# Patient Record
Sex: Male | Born: 1995
Health system: Southern US, Community
[De-identification: ages and names within clinical notes are randomized; demographics above are authoritative.]

---

## 2019-05-01 ENCOUNTER — Telehealth: Payer: Self-pay | Admitting: Family Medicine

## 2019-05-01 DIAGNOSIS — Z0000 Encounter for general adult medical examination without abnormal findings: Secondary | ICD-10-CM

## 2019-05-01 NOTE — Telephone Encounter (Signed)
Called pt to see if there was any thing in particlar that he wanted ordered since no labs in epic and he not on any meds and nothing on problem list and he just wants what ever screening bw he needs for physical. Not having any problems.

## 2019-05-01 NOTE — Telephone Encounter (Signed)
Pt's dad comes to office and dad asked Dr. Nicki Reaper if he would see his son again and Dr. Nicki Reaper agreed. Pt has a CPE 12/15 and would like to have lab work ordered

## 2019-05-01 NOTE — Telephone Encounter (Signed)
Orders put in and pt notified.  

## 2019-05-01 NOTE — Telephone Encounter (Signed)
CBC, lipid, comprehensive metabolic profile

## 2019-05-14 ENCOUNTER — Encounter: Payer: Self-pay | Admitting: Family Medicine

## 2019-06-14 ENCOUNTER — Encounter: Payer: Self-pay | Admitting: Family Medicine

## 2019-07-25 ENCOUNTER — Encounter: Payer: Self-pay | Admitting: Family Medicine

## 2019-08-16 ENCOUNTER — Telehealth: Payer: Self-pay | Admitting: *Deleted

## 2019-08-16 ENCOUNTER — Other Ambulatory Visit: Payer: Self-pay

## 2019-08-16 ENCOUNTER — Ambulatory Visit
Admission: EM | Admit: 2019-08-16 | Discharge: 2019-08-16 | Disposition: A | Discharge status: Home / Self Care | Payer: 59 | Attending: Emergency Medicine | Admitting: Emergency Medicine

## 2019-08-16 DIAGNOSIS — R3989 Other symptoms and signs involving the genitourinary system: Secondary | ICD-10-CM | POA: Diagnosis present

## 2019-08-16 DIAGNOSIS — R1031 Right lower quadrant pain: Secondary | ICD-10-CM | POA: Diagnosis not present

## 2019-08-16 DIAGNOSIS — N50811 Right testicular pain: Secondary | ICD-10-CM | POA: Diagnosis present

## 2019-08-16 DIAGNOSIS — R300 Dysuria: Secondary | ICD-10-CM | POA: Diagnosis present

## 2019-08-16 LAB — POCT URINALYSIS DIP (MANUAL ENTRY)
Bilirubin, UA: NEGATIVE
Glucose, UA: NEGATIVE mg/dL
Ketones, POC UA: NEGATIVE mg/dL
Leukocytes, UA: NEGATIVE
Nitrite, UA: NEGATIVE
Protein Ur, POC: NEGATIVE mg/dL
Spec Grav, UA: >=1.03 — AB (ref 1.010–1.025)
Urobilinogen, UA: 0.2 E.U./dL
pH, UA: 6 (ref 5.0–8.0)

## 2019-08-16 MED ORDER — IBUPROFEN 800 MG PO TABS: 800.0000 mg | ORAL_TABLET | Freq: Three times a day (TID) | ORAL | 0 refills | Status: DC

## 2019-08-16 NOTE — Discharge Instructions (Signed)
Urine did not show signs of infection, but did show trace blood which could be a sign of a kidney stone Urine culture sent.  We will call you with the results.   Self urethral swab also obtained.  We will follow up with you regarding those results as well.  In the meantime, Push fluids and get plenty of rest.   Ibuprofen 800 mg prescribed.  Take as directed for pain control Anticipate follow up with PCP.  Symptoms may be secondary to kidney stone and/or hernia.  You may need additional imaging to rule these things in or out.   Return here or go to ER if you have any new or worsening symptoms such as fever, nausea, vomiting, worsening groin pain, flank pain, discharge, decrease urine output, etc..Marland Kitchen

## 2019-08-16 NOTE — ED Provider Notes (Signed)
[facility]   CC: RT groin discomfort  SUBJECTIVE:  Darius Ramirez is a 24 y.o. male who complains of bladder pressure, RT groin/ testicle discomfort, and dysuria x 1 week.  Patient denies a precipitating event, recent sexual encounter, excessive caffeine intake, straining, working-out or heavy lifting prior to symptoms.  Last sex 2 week ago.  Is married in a monogamous relationship.  1 partner over the past 6 months.  Partner without symptoms.  Localizes the discomfort to RT groin/ testicle, describes as intermittent and dull.  Has tried OTC AZO with temporary relief.  Symptoms are made worse with activity.  Denies similars symptoms.  Denies fever, chills, nausea, vomiting, CP, SOB, urethral discharge, testicular swelling, testicular discoloration, genital rashes or lesions, decreased urine output, changes in bowel habits.    LMP: No LMP for male patient.  ROS: As in HPI.  All other pertinent ROS negative.     History reviewed. No pertinent past medical history. History reviewed. No pertinent surgical history. No Known Allergies No current facility-administered medications on file prior to encounter.   No current outpatient medications on file prior to encounter.   Social History   Socioeconomic History  . Marital status: Married    Spouse name: Not on file  . Number of children: Not on file  . Years of education: Not on file  . Highest education level: Not on file  Occupational History  . Not on file  Tobacco Use  . Smoking status: Never Smoker  . Smokeless tobacco: Never Used  Substance and Sexual Activity  . Alcohol use: Never  . Drug use: Never  . Sexual activity: Yes    Birth control/protection: None    Comment: states he is married, monogamous   Other Topics Concern  . Not on file  Social History Narrative  . Not on file   Social Determinants of Health   Financial Resource Strain:   . Difficulty of Paying Living Expenses:   Food Insecurity:   .  Worried About Charity fundraiser in the Last Year:   . Arboriculturist in the Last Year:   Transportation Needs:   . Film/video editor (Medical):   Marland Kitchen Lack of Transportation (Non-Medical):   Physical Activity:   . Days of Exercise per Week:   . Minutes of Exercise per Session:   Stress:   . Feeling of Stress :   Social Connections:   . Frequency of Communication with Friends and Family:   . Frequency of Social Gatherings with Friends and Family:   . Attends Religious Services:   . Active Member of Clubs or Organizations:   . Attends Archivist Meetings:   Marland Kitchen Marital Status:   Intimate Partner Violence:   . Fear of Current or Ex-Partner:   . Emotionally Abused:   Marland Kitchen Physically Abused:   . Sexually Abused:    Family History  Problem Relation Age of Onset  . Hyperlipidemia Mother   . Diabetes Father   . Hypertension Father   . Hyperlipidemia Father     OBJECTIVE:  Vitals:   08/16/19 1419  BP: (!) 163/103  Pulse: (!) 108  Resp: 20  Temp: 98.4 F (36.9 C)  TempSrc: Oral  SpO2: 97%   General appearance: Alert in no acute distress HEENT: NCAT.  PERRL, EOMI grossly; Oropharynx clear.  Lungs: clear to auscultation bilaterally without adventitious breath sounds Heart: regular rate and rhythm.   Abdomen: soft; non-distended; no tenderness; bowel sounds present;  no guarding Back: no CVA tenderness GU: Declines chaperone.  Circumcised male; no obvious rashes or lesions; no penile discharge; no testicular masses or nodules appreciated, mild tenderness over RT epididymis; negative phren's sign; no obvious swelling; testicles appear symmetrical in size; no discoloration; possible subtle RT inguinal hernia Extremities: no edema; symmetrical with no gross deformities Skin: warm and dry Neurologic: Ambulates from chair to exam table without difficulty Psychological: alert and cooperative; normal mood and affect  Labs Reviewed  POCT URINALYSIS DIP (MANUAL ENTRY) -  Abnormal; Notable for the following components:      Result Value   Spec Grav, UA >=1.030 (*)    Blood, UA trace-intact (*)    All other components within normal limits  URINE CULTURE  CYTOLOGY, (ORAL, ANAL, URETHRAL) ANCILLARY ONLY    ASSESSMENT & PLAN:  1. Right groin pain   2. Right testicular pain   3. Dysuria   4. Sensation of pressure in bladder area     Meds ordered this encounter  Medications  . ibuprofen (ADVIL) 800 MG tablet    Sig: Take 1 tablet (800 mg total) by mouth 3 (three) times daily.    Dispense:  30 tablet    Refill:  0    Order Specific Question:   Supervising Provider    Answer:   Raylene Everts [id number]   Urine did not show signs of infection, but did show trace blood which could be a sign of a kidney stone Urine culture sent.  We will call you with the results.   Self urethral swab also obtained.  We will follow up with you regarding those results as well.  In the meantime, Push fluids and get plenty of rest.   Ibuprofen 800 mg prescribed.  Take as directed for pain control Anticipate follow up with PCP.  Symptoms may be secondary to kidney stone and/or hernia.  You may need additional imaging to rule these things in or out.   Return here or go to ER if you have any new or worsening symptoms such as fever, nausea, vomiting, worsening groin pain, flank pain, discharge, decrease urine output, etc...  Outlined signs and symptoms indicating need for more acute intervention. Patient verbalized understanding. After Visit Summary given.     Lestine Box, PA-C 08/16/19 1513

## 2019-08-16 NOTE — Telephone Encounter (Signed)
Pt called and states he is having pain in lower abdomen that started 5 days ago. Burning and sore testicle. No available appts today. Advised to go to urgent care. Pt states he will.

## 2019-08-16 NOTE — ED Triage Notes (Signed)
Pt reports lower abdominal pressure in lower abdomen.  Will periodically have pain in R testicle which travels up R groin.  Denies swelling or color changes.  Reports burning, frequency with urination.  States no exposure to STDs.   States wife just had baby and was told she had strep B.

## 2019-08-18 LAB — URINE CULTURE
Culture: NO GROWTH
Special Requests: NORMAL

## 2019-08-20 LAB — LIPID PANEL
Chol/HDL Ratio: 7.8 ratio — ABNORMAL HIGH (ref 0.0–5.0)
Cholesterol, Total: 226 mg/dL — ABNORMAL HIGH (ref 100–199)
HDL: 29 mg/dL — ABNORMAL LOW (ref >39)
LDL Chol Calc (NIH): 145 mg/dL — ABNORMAL HIGH (ref 0–99)
Triglycerides: 281 mg/dL — ABNORMAL HIGH (ref 0–149)
VLDL Cholesterol Cal: 52 mg/dL — ABNORMAL HIGH (ref 5–40)

## 2019-08-20 LAB — CBC WITH DIFFERENTIAL/PLATELET
Basophils Absolute: 0.1 10*3/uL (ref 0.0–0.2)
Basos: 1 %
EOS (ABSOLUTE): 0.4 10*3/uL (ref 0.0–0.4)
Eos: 5 %
Hematocrit: 48.4 % (ref 37.5–51.0)
Hemoglobin: 16.5 g/dL (ref 13.0–17.7)
Immature Grans (Abs): 0 10*3/uL (ref 0.0–0.1)
Immature Granulocytes: 0 %
Lymphocytes Absolute: 2.6 10*3/uL (ref 0.7–3.1)
Lymphs: 31 %
MCH: 29.8 pg (ref 26.6–33.0)
MCHC: 34.1 g/dL (ref 31.5–35.7)
MCV: 88 fL (ref 79–97)
Monocytes Absolute: 0.7 10*3/uL (ref 0.1–0.9)
Monocytes: 9 %
Neutrophils Absolute: 4.5 10*3/uL (ref 1.4–7.0)
Neutrophils: 54 %
Platelets: 261 10*3/uL (ref 150–450)
RBC: 5.53 x10E6/uL (ref 4.14–5.80)
RDW: 12.7 % (ref 11.6–15.4)
WBC: 8.2 10*3/uL (ref 3.4–10.8)

## 2019-08-20 LAB — CYTOLOGY, (ORAL, ANAL, URETHRAL) ANCILLARY ONLY
Chlamydia: NEGATIVE
Neisseria Gonorrhea: NEGATIVE
Trichomonas: NEGATIVE

## 2019-08-20 LAB — COMPREHENSIVE METABOLIC PANEL
ALT: 147 IU/L — ABNORMAL HIGH (ref 0–44)
AST: 55 IU/L — ABNORMAL HIGH (ref 0–40)
Albumin/Globulin Ratio: 1.5 (ref 1.2–2.2)
Albumin: 4.9 g/dL (ref 4.1–5.2)
Alkaline Phosphatase: 61 IU/L (ref 39–117)
BUN/Creatinine Ratio: 10 (ref 9–20)
BUN: 11 mg/dL (ref 6–20)
Bilirubin Total: 0.5 mg/dL (ref 0.0–1.2)
CO2: 21 mmol/L (ref 20–29)
Calcium: 9.8 mg/dL (ref 8.7–10.2)
Chloride: 100 mmol/L (ref 96–106)
Creatinine, Ser: 1.05 mg/dL (ref 0.76–1.27)
GFR calc Af Amer: 113 mL/min/{1.73_m2} (ref >59)
GFR calc non Af Amer: 98 mL/min/{1.73_m2} (ref >59)
Globulin, Total: 3.2 g/dL (ref 1.5–4.5)
Glucose: 96 mg/dL (ref 65–99)
Potassium: 4.3 mmol/L (ref 3.5–5.2)
Sodium: 140 mmol/L (ref 134–144)
Total Protein: 8.1 g/dL (ref 6.0–8.5)

## 2019-08-23 ENCOUNTER — Ambulatory Visit (INDEPENDENT_AMBULATORY_CARE_PROVIDER_SITE_OTHER): Payer: 59 | Admitting: Family Medicine

## 2019-08-23 ENCOUNTER — Encounter: Payer: Self-pay | Admitting: Family Medicine

## 2019-08-23 ENCOUNTER — Other Ambulatory Visit: Payer: Self-pay

## 2019-08-23 VITALS — BP 122/76 | Temp 97.3°F | Ht 69.0 in | Wt 224.0 lb

## 2019-08-23 DIAGNOSIS — R300 Dysuria: Secondary | ICD-10-CM | POA: Diagnosis not present

## 2019-08-23 DIAGNOSIS — N50819 Testicular pain, unspecified: Secondary | ICD-10-CM

## 2019-08-23 DIAGNOSIS — R7401 Elevation of levels of liver transaminase levels: Secondary | ICD-10-CM

## 2019-08-23 DIAGNOSIS — R197 Diarrhea, unspecified: Secondary | ICD-10-CM | POA: Diagnosis not present

## 2019-08-23 DIAGNOSIS — Z0000 Encounter for general adult medical examination without abnormal findings: Secondary | ICD-10-CM

## 2019-08-23 LAB — POCT URINALYSIS DIPSTICK
Spec Grav, UA: 1.02 (ref 1.010–1.025)
pH, UA: 5 (ref 5.0–8.0)

## 2019-08-23 NOTE — Progress Notes (Signed)
Subjective:    Patient ID: Darius Ramirez, male    DOB: Jun 09, 1995, 24 y.o.   MRN: [id number]  HPI The patient comes in today for a wellness visit.    A review of their health history was completed.  A review of medications was also completed.  Any needed refills; not on any meds  Eating habits: health conscious  Falls/  MVA accidents in past few months: none  Regular exercise: none  Specialist pt sees on regular basis: none  Preventative health issues were discussed.   Additional concerns: pelvic and testicle pain  Results for orders placed or performed in visit on 08/23/19  POCT urinalysis dipstick  Result Value Ref Range   Color, UA     Clarity, UA     Glucose, UA     Bilirubin, UA     Ketones, UA     Spec Grav, UA 1.020 1.010 - 1.025   Blood, UA     pH, UA 5.0 5.0 - 8.0   Protein, UA     Urobilinogen, UA     Nitrite, UA     Leukocytes, UA     Appearance     Odor     Patient denies any type of chest tightness pressure pain shortness of breath Does have some intermittent diarrhea issues not mucousy not bloody but this Off and on the past couple weeks he does state he took some antibiotics for dysuria that may physician friend wrote for him He has had some intermittent right lower quadrant discomfort that radiates into the testicle he is concerned about the possibility of kidney stone or other problems Patient did have lab work done which showed some significant issues we reviewed all that in detail today which went beyond standard wellness Results for orders placed or performed in visit on 08/23/19  POCT urinalysis dipstick  Result Value Ref Range   Color, UA     Clarity, UA     Glucose, UA     Bilirubin, UA     Ketones, UA     Spec Grav, UA 1.020 1.010 - 1.025   Blood, UA     pH, UA 5.0 5.0 - 8.0   Protein, UA     Urobilinogen, UA     Nitrite, UA     Leukocytes, UA     Appearance     Odor      Review of Systems  Constitutional: Negative for  activity change, appetite change and fever.  HENT: Negative for congestion and rhinorrhea.   Eyes: Negative for discharge.  Respiratory: Negative for cough and wheezing.   Cardiovascular: Negative for chest pain.  Gastrointestinal: Negative for abdominal pain, blood in stool and vomiting.  Genitourinary: Negative for difficulty urinating and frequency.  Musculoskeletal: Negative for neck pain.  Skin: Negative for rash.  Allergic/Immunologic: Negative for environmental allergies and food allergies.  Neurological: Negative for weakness and headaches.  Psychiatric/Behavioral: Negative for agitation.       Objective:   Physical Exam Constitutional:      Appearance: He is well-developed.  HENT:     Head: Normocephalic and atraumatic.     Right Ear: External ear normal.     Left Ear: External ear normal.     Nose: Nose normal.  Eyes:     Pupils: Pupils are equal, round, and reactive to light.  Neck:     Thyroid: No thyromegaly.  Cardiovascular:     Rate and Rhythm: Normal rate and regular rhythm.  Heart sounds: Normal heart sounds. No murmur.  Pulmonary:     Effort: Pulmonary effort is normal. No respiratory distress.     Breath sounds: Normal breath sounds. No wheezing.  Abdominal:     General: Bowel sounds are normal. There is no distension.     Palpations: Abdomen is soft. There is no mass.     Tenderness: There is no abdominal tenderness.  Genitourinary:    Penis: Normal.   Musculoskeletal:        General: Normal range of motion.     Cervical back: Normal range of motion and neck supple.  Lymphadenopathy:     Cervical: No cervical adenopathy.  Skin:    General: Skin is warm and dry.     Findings: No erythema.  Neurological:     Mental Status: He is alert.     Motor: No abnormal muscle tone.  Psychiatric:        Behavior: Behavior normal.        Judgment: Judgment normal.     Testicular exam normal prostate exam normal no hernia      Assessment & Plan:    1. Routine general medical examination at a health care facility Adult wellness-complete.wellness physical was conducted today. Importance of diet and exercise were discussed in detail.  In addition to this a discussion regarding safety was also covered. We also reviewed over immunizations and gave recommendations regarding current immunization needed for age.  In addition to this additional areas were also touched on including: Preventative health exams needed:  Colonoscopy not indicated  Patient was advised yearly wellness exam  - POCT urinalysis dipstick  2. Diarrhea, unspecified type Having some diarrhea but was on antibiotics for several days at a physician friend gave him could be related to C. difficile do stool culture and C. difficile - Stool Culture - Clostridium Difficile by PCR  3. Elevated transaminase level Patient has significant elevation of liver enzymes does not drink denies any drug use we will need to do lab work and ultrasound depending on this could be fatty liver could be other underlying cause may need gastroenterology consultation stay away from all alcohol minimal Tylenol use recommended healthy diet regular activity and losing weight recommended - US Abdomen Limited RUQ - Hepatitis B Surface AntiGEN - Hepatitis C antibody - Anti-smooth muscle antibody, IgG - Ceruloplasmin - Iron Binding Cap (TIBC)([facility]) - Ferritin  4. Dysuria Patient with right testicular pain lower pelvic pain urinalysis negative for infection had negative GC chlamydia negative culture and under the microscope no red blood cells referral to urology for further evaluation may need ultrasound - Ambulatory referral to Urology  5. Pain in testicle, unspecified laterality Complains of pain in the testicle region but no masses are felt referral to urology - Ambulatory referral to Urology

## 2019-08-23 NOTE — Telephone Encounter (Signed)
error 

## 2019-08-24 LAB — CLOSTRIDIUM DIFFICILE BY PCR: Toxigenic C. Difficile by PCR: NEGATIVE

## 2019-08-27 LAB — IRON AND TIBC
Iron Saturation: 19 % (ref 15–55)
Iron: 64 ug/dL (ref 38–169)
Total Iron Binding Capacity: 329 ug/dL (ref 250–450)
UIBC: 265 ug/dL (ref 111–343)

## 2019-08-27 LAB — FERRITIN: Ferritin: 298 ng/mL (ref 30–400)

## 2019-08-27 LAB — HEPATITIS C ANTIBODY: Hep C Virus Ab: 0.1 s/co ratio (ref 0.0–0.9)

## 2019-08-27 LAB — ANTI-SMOOTH MUSCLE ANTIBODY, IGG: Smooth Muscle Ab: 4 Units (ref 0–19)

## 2019-08-27 LAB — CERULOPLASMIN: Ceruloplasmin: 24.1 mg/dL (ref 16.0–31.0)

## 2019-08-27 LAB — HEPATITIS B SURFACE ANTIGEN: Hepatitis B Surface Ag: NEGATIVE

## 2019-08-28 ENCOUNTER — Encounter: Payer: Self-pay | Admitting: Family Medicine

## 2019-08-28 LAB — STOOL CULTURE: E coli, Shiga toxin Assay: NEGATIVE

## 2019-08-29 ENCOUNTER — Encounter: Payer: Self-pay | Admitting: Family Medicine

## 2019-09-03 ENCOUNTER — Ambulatory Visit (HOSPITAL_COMMUNITY)
Admission: RE | Admit: 2019-09-03 | Discharge: 2019-09-03 | Disposition: A | Discharge status: Home / Self Care | Payer: 59 | Source: Ambulatory Visit | Attending: Family Medicine | Admitting: Family Medicine

## 2019-09-03 ENCOUNTER — Other Ambulatory Visit: Payer: Self-pay | Admitting: *Deleted

## 2019-09-03 ENCOUNTER — Other Ambulatory Visit: Payer: Self-pay

## 2019-09-03 DIAGNOSIS — R7401 Elevation of levels of liver transaminase levels: Secondary | ICD-10-CM | POA: Insufficient documentation

## 2019-09-03 DIAGNOSIS — R748 Abnormal levels of other serum enzymes: Secondary | ICD-10-CM

## 2019-09-24 ENCOUNTER — Telehealth: Payer: Self-pay | Admitting: Family Medicine

## 2019-09-24 DIAGNOSIS — R109 Unspecified abdominal pain: Secondary | ICD-10-CM

## 2019-09-24 DIAGNOSIS — R748 Abnormal levels of other serum enzymes: Secondary | ICD-10-CM

## 2019-09-24 DIAGNOSIS — R198 Other specified symptoms and signs involving the digestive system and abdomen: Secondary | ICD-10-CM

## 2019-09-24 NOTE — Telephone Encounter (Signed)
Please let the patient know that I am perfectly on board with that Go ahead with referral [facility] elevated liver enzymes and abdominal symptoms Please explain referral process to the patient thank you

## 2019-09-24 NOTE — Telephone Encounter (Signed)
Left message for pt to return call. Referral in.

## 2019-09-24 NOTE — Telephone Encounter (Signed)
Pt called requesting referral to GI, states Dr. Nicki Reaper wanted to wait for repeat labs but pt states symptoms continue, are no worse but no better  Please advise & if "ok" to refer - please initiate referral so it may be processed

## 2019-10-07 ENCOUNTER — Encounter: Payer: Self-pay | Admitting: Family Medicine

## 2019-10-08 ENCOUNTER — Encounter: Payer: Self-pay | Admitting: Internal Medicine

## 2019-10-18 ENCOUNTER — Ambulatory Visit: Payer: 59 | Admitting: Family Medicine

## 2019-10-18 ENCOUNTER — Other Ambulatory Visit: Payer: Self-pay

## 2019-10-18 ENCOUNTER — Encounter: Payer: Self-pay | Admitting: Family Medicine

## 2019-10-18 VITALS — BP 128/78 | HR 104 | Temp 97.3°F | Wt 220.6 lb

## 2019-10-18 DIAGNOSIS — R590 Localized enlarged lymph nodes: Secondary | ICD-10-CM

## 2019-10-18 DIAGNOSIS — K0510 Chronic gingivitis, plaque induced: Secondary | ICD-10-CM

## 2019-10-18 MED ORDER — AMOXICILLIN 500 MG PO CAPS: 500.0000 mg | ORAL_CAPSULE | Freq: Two times a day (BID) | ORAL | 0 refills | Status: DC

## 2019-10-18 NOTE — Progress Notes (Signed)
Patient ID: Darius Ramirez, male    DOB: 04/28/1996, 24 y.o.   MRN: [id number]   Chief Complaint  Patient presents with  . lymph node swelling   Subjective:    HPI  Patient comes in today after a sore throat 1 month ago that left swollen glands on right side of neck.  The lymph node is mobile and non-tender. No fever.  Patient not having any pain or discomfort. Has had some rt lower tooth discomfort and is seeing dentist next week.  No abscess in mouth or tooth pain.  Gums are red on right lower molars.  Denies cavity or broken tooth. Has appt with dentist in 4 days to see his irritated gums.  No n/v/d, coughing, runny nose, ear pain, or rash on head/neck.   Medical History Darius Ramirez has no past medical history on file.   Outpatient Encounter Medications as of 10/18/2019  Medication Sig  . amoxicillin (AMOXIL) 500 MG capsule Take 1 capsule (500 mg total) by mouth 2 (two) times daily.  Marland Kitchen ibuprofen (ADVIL) 800 MG tablet Take 1 tablet (800 mg total) by mouth 3 (three) times daily.   No facility-administered encounter medications on file as of 10/18/2019.     Review of Systems  Constitutional: Negative for chills, fatigue and fever.  HENT: Positive for dental problem (redness to gums). Negative for ear pain, rhinorrhea, sinus pressure, sinus pain and sore throat.        +enlarged lump on rt neck  Eyes: Negative for pain, discharge and itching.  Respiratory: Negative for cough.   Skin: Negative for rash and wound.     Vitals BP 128/78   Pulse (!) 104   Temp (!) 97.3 F (36.3 C)   Wt 220 lb 9.6 oz (100.1 kg)   SpO2 98%   BMI 32.58 kg/m   Objective:   Physical Exam Vitals and nursing note reviewed.  Constitutional:      General: He is not in acute distress.    Appearance: Normal appearance. He is not ill-appearing.  HENT:     Head: Normocephalic and atraumatic.     Right Ear: Ear canal and external ear normal. There is no impacted cerumen.     Left Ear: Ear  canal and external ear normal. There is no impacted cerumen.     Nose: Nose normal. No congestion or rhinorrhea.     Mouth/Throat:     Mouth: Mucous membranes are moist.     Pharynx: No oropharyngeal exudate or posterior oropharyngeal erythema.     Comments: +erythema and swelling to gingiva on rt lower molars. No abscess or broken teeth or dental caries. Eyes:     Extraocular Movements: Extraocular movements intact.     Conjunctiva/sclera: Conjunctivae normal.     Pupils: Pupils are equal, round, and reactive to light.  Cardiovascular:     Pulses: Normal pulses.  Pulmonary:     Effort: Pulmonary effort is normal. No respiratory distress.  Musculoskeletal:     Cervical back: Normal range of motion.  Lymphadenopathy:     Cervical: Cervical adenopathy (small mobile LN on rt anterior cervical area) present.  Skin:    General: Skin is warm and dry.     Findings: No erythema or rash.  Neurological:     Mental Status: He is alert.      Assessment and Plan   1. Gingivitis - amoxicillin (AMOXIL) 500 MG capsule; Take 1 capsule (500 mg total) by mouth 2 (two) times daily.  Dispense: 14 capsule; Refill: 0  2. Cervical lymphadenopathy - amoxicillin (AMOXIL) 500 MG capsule; Take 1 capsule (500 mg total) by mouth 2 (two) times daily.  Dispense: 14 capsule; Refill: 0    Gave amoxicillin and f/u with dentist next week as scheduled.  Cont with mouth washes and flossing.  Brushing teeth 2x per day. Likely cervical lymphadenopathy related to gingival infection. If worsening the rto.  Pt in agreement.  F/u prn.

## 2019-10-18 NOTE — Patient Instructions (Signed)
Gingivitis Gingivitis is redness, soreness, and swelling (inflammation) of the gums. This condition is usually mild and clears up with treatment and proper cleaning of the teeth. What are the causes? This condition is caused by germs (plaque) that build up on the teeth and gums. This happens because of poor care and cleaning of the mouth and teeth (oral hygiene). What increases the risk? The following factors may make you more likely to get this condition:  Not properly cleaning and taking care of your mouth and teeth.  Eating a diet that does not provide proper nutrition.  Taking certain medicines.  Being pregnant.  Going through puberty or menopause.  Using tobacco products.  Having certain medical conditions, such as: ? Diabetes. ? Some infections. ? Dry mouth.  Wearing dental devices that do not fit well. What are the signs or symptoms?   Gums that bleed easily. This may happen during flossing or brushing.  Swollen gums.  Gums that are bright red or purple.  Receding gums. This means that the gums are wearing away from the teeth so that more of the tooth is exposed.  Bad breath. How is this treated? This condition may be treated by:  Having your teeth and gums cleaned at the dentist's office.  Taking good care of your mouth and teeth at home. This includes careful brushing and regular flossing.  Using a mouthwash. In some cases, a mouthwash may be prescribed or suggested. Very bad cases of this condition may be treated with antibiotic medicine or surgery. Follow these instructions at home:      Follow instructions from your dentist about how to clean your teeth. Make sure you: ? Brush your teeth two times a day with a soft-bristled toothbrush. ? Floss at least one time each day. Do this before you brush your teeth. ? Use a mouthwash as told by your dentist.  Eat a well-balanced diet. Between meals, limit your intake of foods and drinks that have sugar in  them.  See a dentist on a regular basis for cleaning and checkups.  Do not use any products that contain nicotine or tobacco. These products include cigarettes, e-cigarettes, and chewing tobacco. If you need help quitting, ask your doctor.  If you were prescribed an antibiotic medicine, take it as told by your dentist. Do not stop using it even if you start to feel better.  Keep all follow-up visits as told by your dentist. This is important. Contact a doctor if:  You have a fever.  You have a lot of bleeding from your gums.  You have pain in your gums or teeth.  You have trouble chewing.  You have any loose or infected teeth.  You have swollen glands in your face or neck. Summary  Gingivitis is redness, soreness, and swelling (inflammation) of the gums.  This condition often clears up with treatment and proper cleaning of the teeth.  Brush your teeth two times a day. Floss at least one time each day.  Keep all follow-up visits as told by your dentist. This information is not intended to replace advice given to you by your health care provider. Make sure you discuss any questions you have with your health care provider. Document Revised: 12/20/2017 Document Reviewed: 12/20/2017 Elsevier Patient Education  [employer].

## 2019-11-12 ENCOUNTER — Ambulatory Visit: Payer: 59 | Admitting: Internal Medicine

## 2020-01-15 ENCOUNTER — Other Ambulatory Visit: Payer: Self-pay

## 2020-01-15 ENCOUNTER — Encounter: Payer: Self-pay | Admitting: Family Medicine

## 2020-01-15 ENCOUNTER — Ambulatory Visit: Payer: 59 | Admitting: Family Medicine

## 2020-01-15 VITALS — BP 130/82 | Temp 97.3°F | Wt 216.4 lb

## 2020-01-15 DIAGNOSIS — L0291 Cutaneous abscess, unspecified: Secondary | ICD-10-CM

## 2020-01-15 MED ORDER — DOXYCYCLINE HYCLATE 100 MG PO TABS: 100.0000 mg | ORAL_TABLET | Freq: Two times a day (BID) | ORAL | 0 refills | Status: DC

## 2020-01-15 NOTE — Progress Notes (Signed)
   Subjective:    Patient ID: Darius Ramirez, male    DOB: 21-Feb-1996, 24 y.o.   MRN: [id number]  HPI Pt here for sore on inner part of left leg near groin area. Swollen, red, painful and hot to touch. Pt has popped area twice and did get blood and pus out. Pt states that it has been there about 3 days. He states this area became sore than it drained then it became sore again then drained again now is doing a little bit better denies fever chills Review of Systems See above    Objective:   Physical Exam  On examination small abscess left inguinal area no lymph node firm area with some fluctuance  With patient's permission the area was numbed with 1% lidocaine.  Betadine applied 11.  Blade for incision Minimal discharge Bandage place     Assessment & Plan:  Small abscess Minimal drainage with incision Antibiotics warm compresses recommended Warning signs discussed Follow-up if ongoing troubles

## 2020-01-19 LAB — WOUND CULTURE: Organism ID, Bacteria: NONE SEEN

## 2020-01-19 LAB — SPECIMEN STATUS REPORT

## 2020-04-10 IMAGING — US US ABDOMEN LIMITED
1 series · 14 of 25 positions shown · non-contrast
Comparison: No prior.

CLINICAL DATA: Elevated LFTs.

EXAM:
ULTRASOUND ABDOMEN LIMITED RIGHT UPPER QUADRANT

[Series 1: us abdomen limited · 0.24mm/px · 14 of 61 slices shown]
[im 1/61]
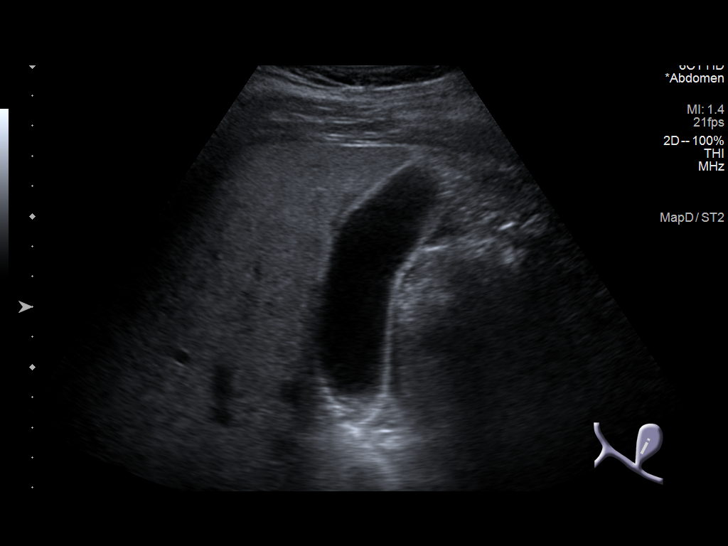
[im 6/61]
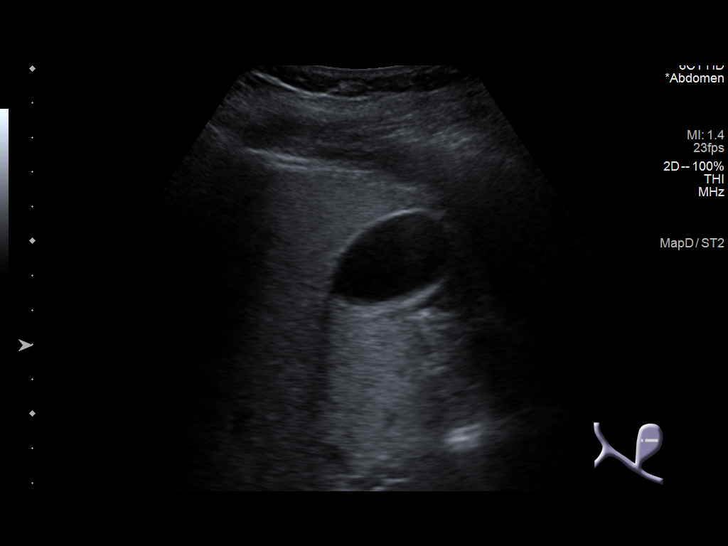
[im 11/61]
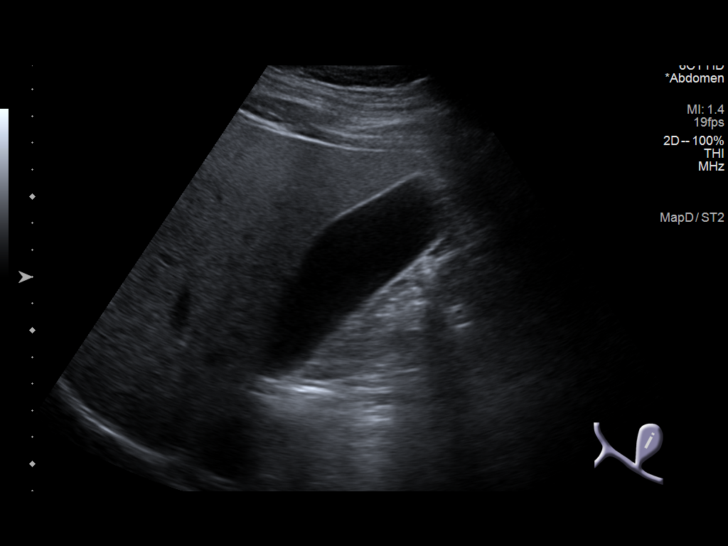
[im 16/61]
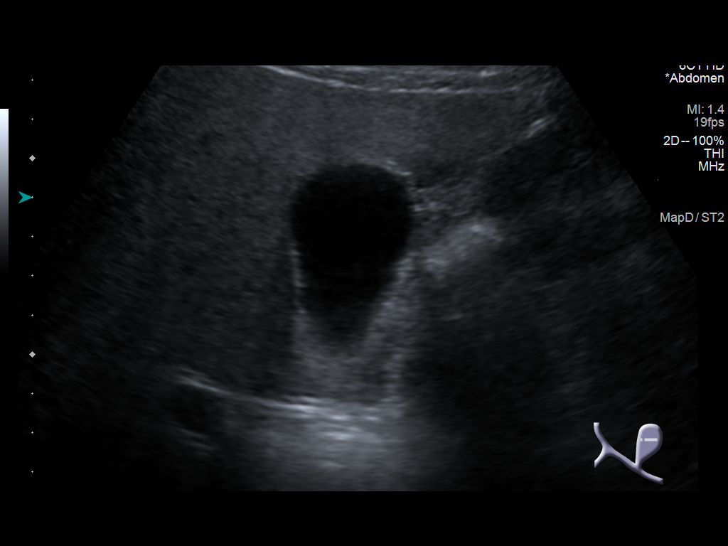
[im 21/61]
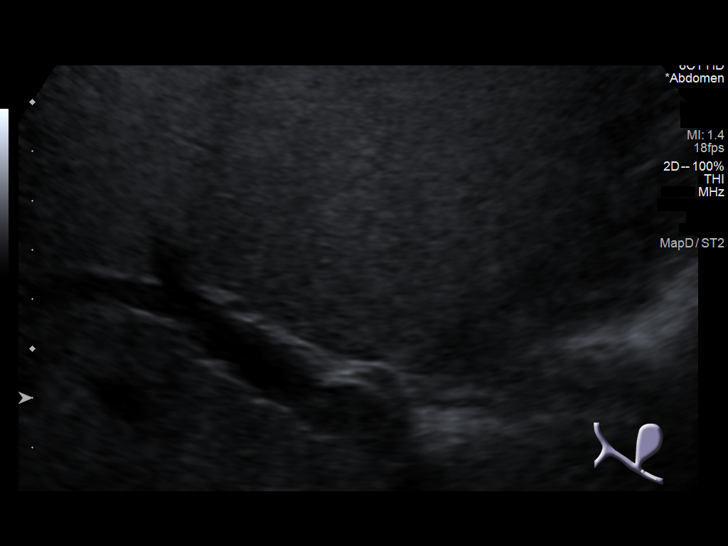
[im 23/61]
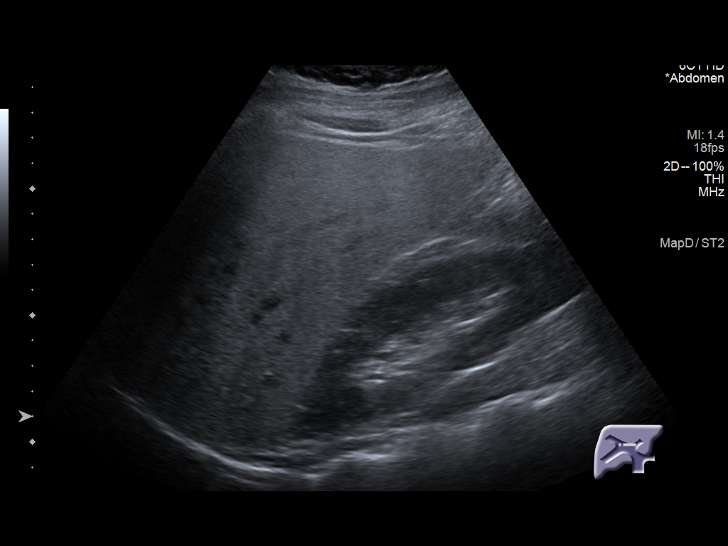
[im 28/61]
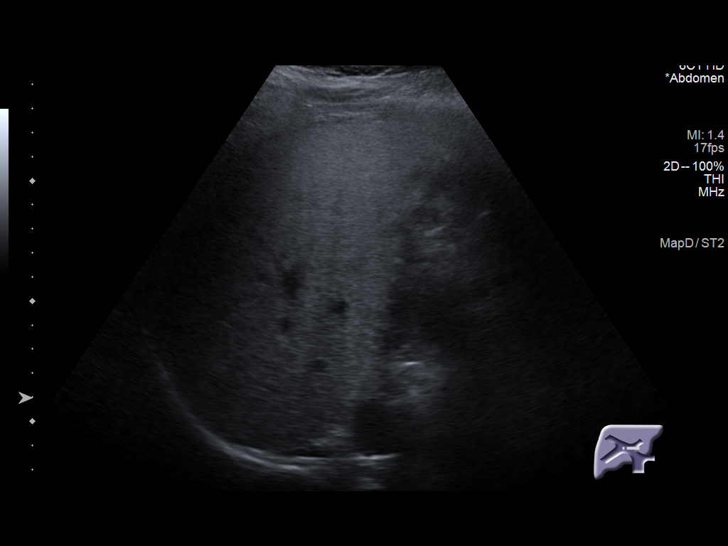
[im 33/61]
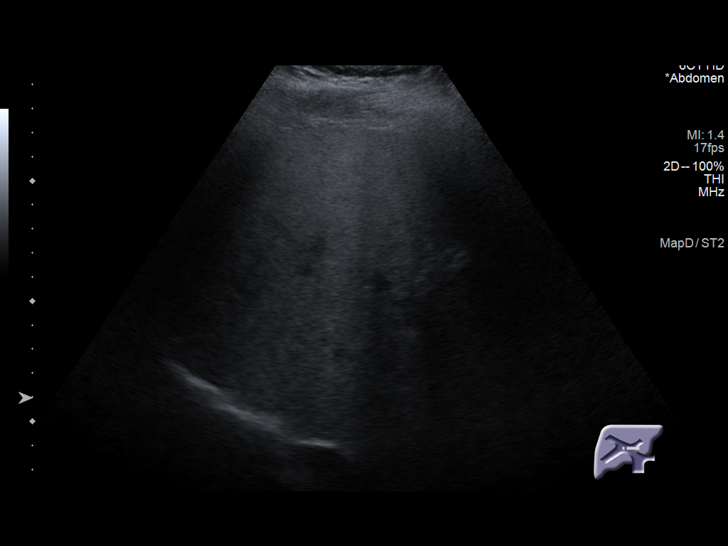
[im 38/61]
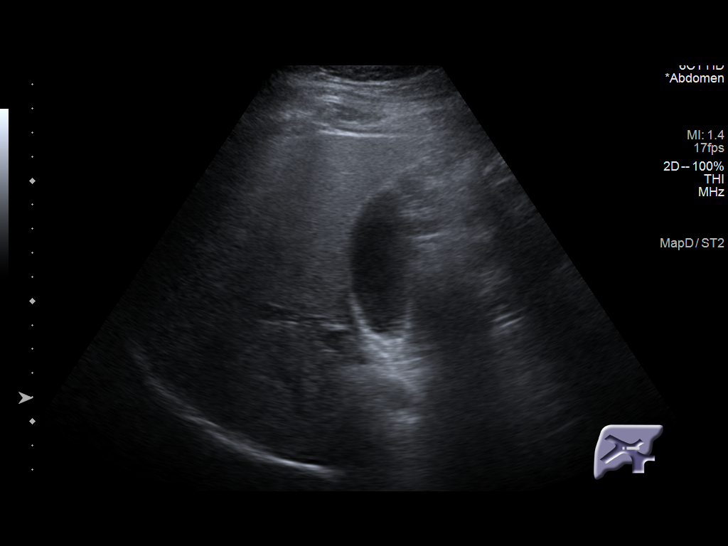
[im 41/61]
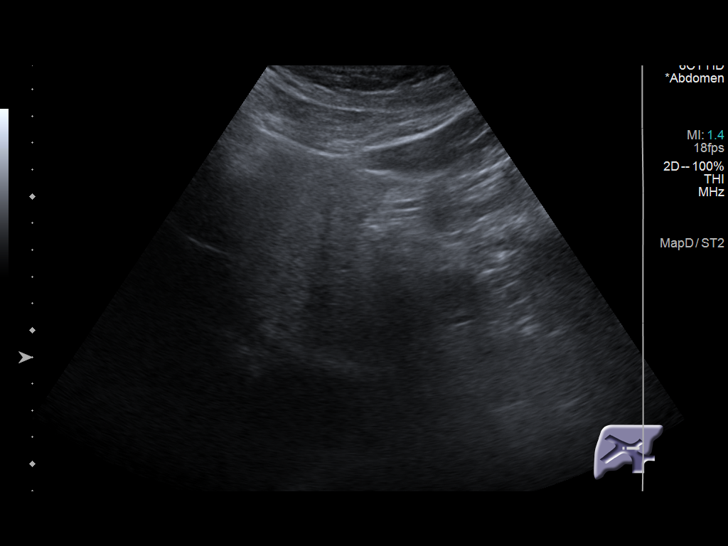
[im 46/61]
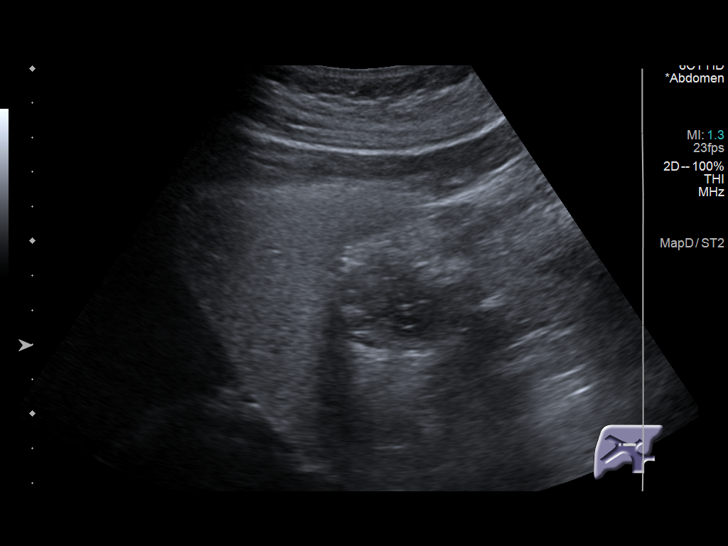
[im 51/61]
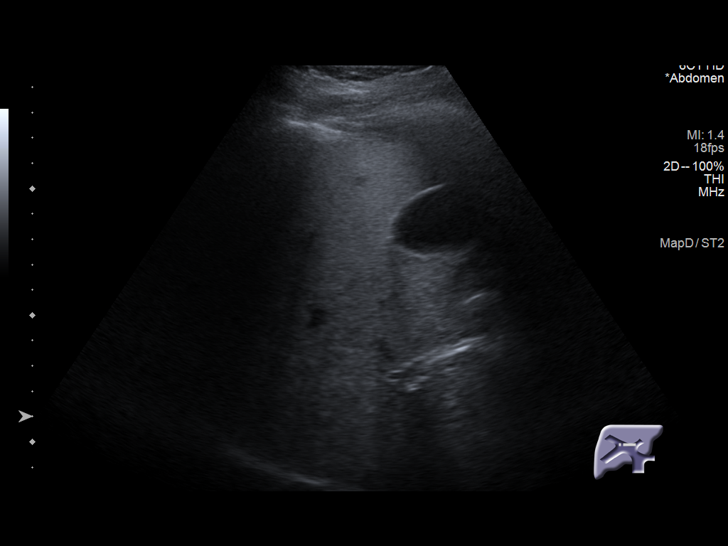
[im 56/61]
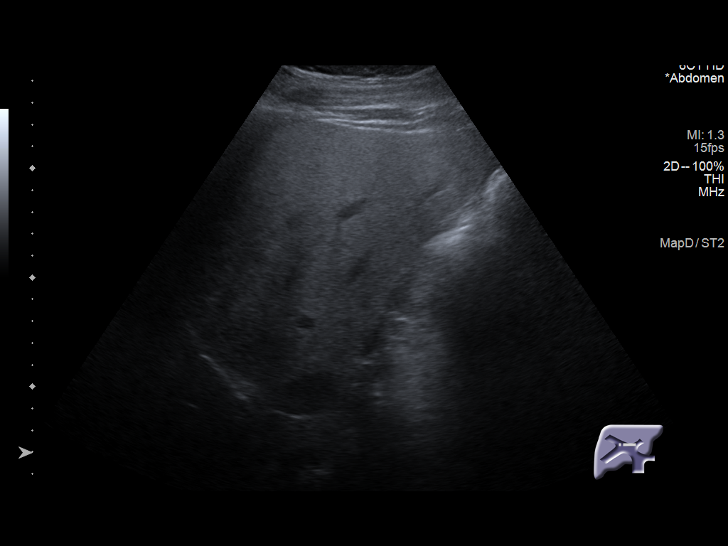
[im 61/61]
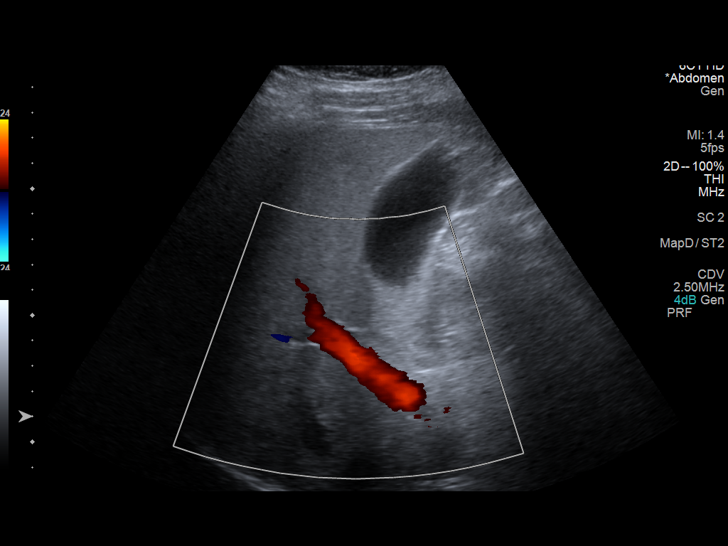

[14 of 25 positions shown; findings below may reference images not displayed]

FINDINGS: Gallbladder:

No gallstones or wall thickening visualized. No sonographic Murphy
sign noted by sonographer.

Common bile duct:

Diameter: 3.6 mm

Liver:

Increased echogenicity consistent fatty infiltration or
hepatocellular disease. Portal vein is patent on color Doppler
imaging with normal direction of blood flow towards the liver.

Other: None.
IMPRESSION: 1.  No gallstones or biliary distention.

2. Increased hepatic echogenicity consistent fatty infiltration or
hepatocellular disease.

## 2020-08-28 DIAGNOSIS — Z419 Encounter for procedure for purposes other than remedying health state, unspecified: Secondary | ICD-10-CM | POA: Diagnosis not present

## 2020-09-27 DIAGNOSIS — Z419 Encounter for procedure for purposes other than remedying health state, unspecified: Secondary | ICD-10-CM | POA: Diagnosis not present

## 2020-10-28 DIAGNOSIS — Z419 Encounter for procedure for purposes other than remedying health state, unspecified: Secondary | ICD-10-CM | POA: Diagnosis not present

## 2020-11-27 DIAGNOSIS — Z419 Encounter for procedure for purposes other than remedying health state, unspecified: Secondary | ICD-10-CM | POA: Diagnosis not present

## 2020-12-16 ENCOUNTER — Encounter: Payer: Self-pay | Admitting: Family Medicine

## 2020-12-17 ENCOUNTER — Telehealth: Payer: Self-pay | Admitting: Family Medicine

## 2020-12-17 DIAGNOSIS — Z13220 Encounter for screening for lipoid disorders: Secondary | ICD-10-CM

## 2020-12-17 DIAGNOSIS — Z79899 Other long term (current) drug therapy: Secondary | ICD-10-CM

## 2020-12-17 DIAGNOSIS — Z0000 Encounter for general adult medical examination without abnormal findings: Secondary | ICD-10-CM

## 2020-12-17 NOTE — Telephone Encounter (Signed)
Patient is requesting lab work and complete basic panel workup and A1C checked

## 2020-12-17 NOTE — Telephone Encounter (Signed)
Pt contacted and verbalized understanding. Pt decided to not have A1C done at this time. Pt also scheduled physical for 01/15/21 at 3 pm with provider.

## 2020-12-17 NOTE — Telephone Encounter (Signed)
Last labs completed 08/23/19: C Diff, stool culture, ferritin, tibc, ceruloplasmin, anti smooth muscle, hep c antibody, hep b surface antigen 08/19/19: cbc, cmp, lipid. Please advise. Thank you

## 2020-12-17 NOTE — Telephone Encounter (Signed)
We will go ahead and order lab work but patient will also need to schedule follow-up visit Lipid, metabolic 7, liver panel, CBC  Also talk with patient.  His previous glucose was normal.  A1c are useful as a monitoring of sugar when sugar is elevated.  We can order A1c but currently do not have a diagnosis to put up with that so therefore it may not be covered.  If he wants an A1c go ahead and order it.  If he would like to wait and see what his lab work shows then move forward with decision whether or not to do A1c would seem to be the more reasonable approach

## 2020-12-18 ENCOUNTER — Ambulatory Visit
Admission: RE | Admit: 2020-12-18 | Discharge: 2020-12-18 | Disposition: A | Discharge status: Home / Self Care | Payer: Medicaid Other | Source: Ambulatory Visit | Attending: Emergency Medicine | Admitting: Emergency Medicine

## 2020-12-18 VITALS — BP 155/99 | HR 109 | Temp 98.2°F | Resp 18

## 2020-12-18 DIAGNOSIS — S30861A Insect bite (nonvenomous) of abdominal wall, initial encounter: Secondary | ICD-10-CM

## 2020-12-18 DIAGNOSIS — L03311 Cellulitis of abdominal wall: Secondary | ICD-10-CM

## 2020-12-18 DIAGNOSIS — W57XXXA Bitten or stung by nonvenomous insect and other nonvenomous arthropods, initial encounter: Secondary | ICD-10-CM | POA: Diagnosis not present

## 2020-12-18 MED ORDER — DOXYCYCLINE HYCLATE 100 MG PO CAPS
100.0000 mg | ORAL_CAPSULE | Freq: Two times a day (BID) | ORAL | 0 refills | Status: DC
Start: 2020-12-18 — End: 2020-12-25

## 2020-12-18 NOTE — ED Provider Notes (Signed)
[facility]   [id number] 12/18/20 Arrival Time: 1701   CC:   SUBJECTIVE:  Darius Ramirez is a 25 y.o. male who presents with a tick bite to abdomen 11 days ago.  Removed tick same day it was attached.  Over the past 4-5 days began noticing redness, and swelling at tick bite site.  Denies alleviating or aggravating factors.  Denies previous symptoms.  Reports mild headaches, LAD in LT groin, leg cramps.  Denies fever, chills, nausea, vomiting, dizziness, weakness, rash, or abdominal pain.    ROS: As per HPI.  All other pertinent ROS negative.     History reviewed. No pertinent past medical history. History reviewed. No pertinent surgical history. No Known Allergies No current facility-administered medications on file prior to encounter.   No current outpatient medications on file prior to encounter.   Social History   Socioeconomic History   Marital status: Married    Spouse name: Not on file   Number of children: Not on file   Years of education: Not on file   Highest education level: Not on file  Occupational History   Not on file  Tobacco Use   Smoking status: Never   Smokeless tobacco: Never  Substance and Sexual Activity   Alcohol use: Never   Drug use: Never   Sexual activity: Yes    Birth control/protection: None    Comment: states he is married, monogamous   Other Topics Concern   Not on file  Social History Narrative   Not on file   Social Determinants of Health   Financial Resource Strain: Not on file  Food Insecurity: Not on file  Transportation Needs: Not on file  Physical Activity: Not on file  Stress: Not on file  Social Connections: Not on file  Intimate Partner Violence: Not on file   Family History  Problem Relation Age of Onset   Hyperlipidemia Mother    Diabetes Father    Hypertension Father    Hyperlipidemia Father     OBJECTIVE:  Vitals:   12/18/20 1740  BP: (!) 155/99  Pulse: (!) 109  Resp: 18  Temp: 98.2 F  (36.8 C)  TempSrc: Oral  SpO2: 98%     General appearance: alert; no distress Head: NCAT Eyes: PERRL, EOMI grossly Lungs: Normal respiratory effort Skin: area of erythema and induration to LT abdomen in 4-5 o'clock position in relation to navel, mildly TTP, no drainage or bleeding Psychological: alert and cooperative; normal mood and affect   ASSESSMENT & PLAN:  1. Tick bite of abdomen, initial encounter   2. Cellulitis of abdominal wall     Meds ordered this encounter  Medications   doxycycline (VIBRAMYCIN) 100 MG capsule    Sig: Take 1 capsule (100 mg total) by mouth 2 (two) times daily.    Dispense:  20 capsule    Refill:  0    Order Specific Question:   Supervising Provider    Answer:   Raylene Everts [id number]    Wash site daily with warm water and mild soap Keep covered to avoid friction Take antibiotic as prescribed and to completion Follow up here or with PCP if symptoms persists Return or go to the ED if you have any new or worsening symptoms increased redness, swelling, pain, nausea, vomiting, fever, chills, etc...   Reviewed expectations re: course of current medical issues. Questions answered. Outlined signs and symptoms indicating need for more acute intervention. Patient verbalized understanding. After Visit Summary given.  Lestine Box, PA-C 12/18/20 1806

## 2020-12-18 NOTE — ED Triage Notes (Signed)
Tick bite last week.  Today has a red area on ABD and having headaches and muscle aches.

## 2020-12-18 NOTE — Discharge Instructions (Addendum)
Wash site daily with warm water and mild soap Keep covered to avoid friction Take antibiotic as prescribed and to completion Follow up here or with PCP if symptoms persists Return or go to the ED if you have any new or worsening symptoms increased redness, swelling, pain, nausea, vomiting, fever, chills, etc...  

## 2020-12-20 ENCOUNTER — Other Ambulatory Visit: Payer: Self-pay

## 2020-12-20 ENCOUNTER — Emergency Department (HOSPITAL_COMMUNITY)
Admission: EM | Admit: 2020-12-20 | Discharge: 2020-12-20 | Disposition: A | Discharge status: Home / Self Care | Payer: Medicaid Other | Attending: Emergency Medicine | Admitting: Emergency Medicine

## 2020-12-20 DIAGNOSIS — S30861A Insect bite (nonvenomous) of abdominal wall, initial encounter: Secondary | ICD-10-CM | POA: Diagnosis not present

## 2020-12-20 DIAGNOSIS — Z9189 Other specified personal risk factors, not elsewhere classified: Secondary | ICD-10-CM

## 2020-12-20 DIAGNOSIS — W57XXXA Bitten or stung by nonvenomous insect and other nonvenomous arthropods, initial encounter: Secondary | ICD-10-CM | POA: Insufficient documentation

## 2020-12-20 DIAGNOSIS — R000 Tachycardia, unspecified: Secondary | ICD-10-CM | POA: Insufficient documentation

## 2020-12-20 DIAGNOSIS — S30861D Insect bite (nonvenomous) of abdominal wall, subsequent encounter: Secondary | ICD-10-CM

## 2020-12-20 MED ORDER — DOXYCYCLINE HYCLATE 100 MG PO CAPS: 100.0000 mg | ORAL_CAPSULE | Freq: Two times a day (BID) | ORAL | 0 refills | Status: DC

## 2020-12-20 NOTE — ED Triage Notes (Signed)
Pt diagnosed with a "tick borne illness" 2 days ago at urgent care was placed on doxy has taken 3 doses and is having palpitations.  Tick bit on stomach left side of belly button.  Skin warm and dry.

## 2020-12-20 NOTE — ED Provider Notes (Signed)
[facility] Provider Note   CSN: [id number] Arrival date & time: 12/20/20  0846     History Chief Complaint  Patient presents with  . Chest Pain    Darius Ramirez is a 25 y.o. male who found a tick attached to his left lower abdomen 13 days ago which he promptly removed and states it was easily removed she does not feel like it was deeply embedded, but states that since then he has had spreading redness around the tick bite site and a swollen lymph node in his left groin.  He was seen by our urgent care center for this 2 days ago which time he was placed on doxycycline for concern of tickborne illness.  He does endorse subjective fevers describing intermittent chills and hot flushes, with T-max of 100.  He also states that for about 4 days last week he had a persistent headache which is now better.  Yesterday morning he noted increasing shortness of breath and palpitations with exertion along with a aching sensation in his upper mid chest region.  He was golfing when he noticed the symptoms.  Symptom-free at rest.  He denies rash, neck pain or stiffness, and as mentioned his headache is resolved.  He has had a 3 doses of his doxycycline, last dose taken this morning.  Denies any joint pain or swelling.  Also denies cough, abdominal pain, nausea or vomiting, dysuria.  Of note, he states the area of redness has receded since starting the antibiotic.  The history is provided by the patient and medical records.      No past medical history on file.  There are no problems to display for this patient.   No past surgical history on file.     Family History  Problem Relation Age of Onset  . Hyperlipidemia Mother   . Diabetes Father   . Hypertension Father   . Hyperlipidemia Father     Social History   Tobacco Use  . Smoking status: Never  . Smokeless tobacco: Never  Substance Use Topics  . Alcohol use: Never  . Drug use: Never    Home  Medications Prior to Admission medications   Medication Sig Start Date End Date Taking? Authorizing Provider  doxycycline (VIBRAMYCIN) 100 MG capsule Take 1 capsule (100 mg total) by mouth 2 (two) times daily. 12/20/20  Yes Chike Farrington, Almyra Free, PA-C  doxycycline (VIBRAMYCIN) 100 MG capsule Take 1 capsule (100 mg total) by mouth 2 (two) times daily. 12/18/20   Lestine Box, PA-C    Allergies    Patient has no known allergies.  Review of Systems   Review of Systems  Constitutional:  Positive for fever.  HENT:  Negative for congestion and sore throat.   Eyes: Negative.   Respiratory:  Positive for chest tightness and shortness of breath.   Cardiovascular:  Positive for palpitations. Negative for chest pain.  Gastrointestinal:  Negative for abdominal pain, nausea and vomiting.  Genitourinary: Negative.   Musculoskeletal:  Negative for arthralgias, joint swelling, neck pain and neck stiffness.  Skin: Negative.  Negative for rash and wound.  Neurological:  Positive for headaches. Negative for dizziness, weakness, light-headedness and numbness.  Psychiatric/Behavioral: Negative.    All other systems reviewed and are negative.  Physical Exam Updated Vital Signs BP 130/88   Pulse (!) 102   Temp 98.5 F (36.9 C) (Oral)   Resp (!) 21   Ht '5\' 7"'$  (1.702 m)   Wt 97.5 kg   SpO2  100%   BMI 33.67 kg/m   Physical Exam Vitals and nursing note reviewed.  Constitutional:      Appearance: He is well-developed.  HENT:     Head: Normocephalic and atraumatic.  Eyes:     Conjunctiva/sclera: Conjunctivae normal.  Cardiovascular:     Rate and Rhythm: Regular rhythm. Tachycardia present.     Heart sounds: Normal heart sounds.  Pulmonary:     Effort: Pulmonary effort is normal.     Breath sounds: Normal breath sounds. No decreased breath sounds, wheezing, rhonchi or rales.  Abdominal:     General: Bowel sounds are normal.     Palpations: Abdomen is soft.     Tenderness: There is no abdominal  tenderness. There is no guarding.  Musculoskeletal:        General: Normal range of motion.     Cervical back: Normal range of motion.  Skin:    General: Skin is warm and dry.     Comments: Scab present left lower abdominal wall, 0.5 cm surrounding erythema.   Neurological:     Mental Status: He is alert.    ED Results / Procedures / Treatments   Labs (all labs ordered are listed, but only abnormal results are displayed) Labs Reviewed  ROCKY MTN SPOTTED FVR ABS PNL(IGG+IGM)  LYME DISEASE DNA BY PCR(BORRELIA BURG)    EKG EKG Interpretation  Date/Time:  'Sunday December 20 2020 09:48:12 EDT Ventricular Rate:  110 PR Interval:  127 QRS Duration: 91 QT Interval:  323 QTC Calculation: 437 R Axis:   50 Text Interpretation: Sinus tachycardia No old tracing to compare Confirmed by Wentz, Elliott ([id number]) on 12/20/2020 9:52:18 AM  Radiology No results found.  Procedures Procedures   Medications Ordered in ED Medications - No data to display  ED Course  I have reviewed the triage vital signs and the nursing notes.  Pertinent labs & imaging results that were available during my care of the patient were reviewed by me and considered in my medical decision making (see chart for details).    MDM Rules/Calculators/A&P                           History and exam suggesting sx related to his tick borne infection, most suggestive of Lyme infection.  Lyme and RMSF testing ordered and pending at time of dc.   Pt advised to continue with the doxycycline, rest, fluids, tylenol or motrin for fever and sx relief.  EKG normal with no signs of pericarditis, no ectopy while on monitor, suspect mild tachycardia secondary to infection/fevers.  Discussed with Dr Wentz prior to dc home.   Doxy extended for a full 14 day course.Return precautions and/or f/u with pcp recommended.  Final Clinical Impression(s) / ED Diagnoses Final diagnoses:  At high risk for tick borne illness  Tick bite of abdomen,  subsequent encounter    Rx / DC Orders ED Discharge Orders          Ordered    doxycycline (VIBRAMYCIN) 100 MG capsule  2 times daily        07'$ /24/22 1040             Evalee Jefferson, PA-C 12/20/20 1058    Daleen Bo, MD 12/20/20 [phone]

## 2020-12-20 NOTE — Discharge Instructions (Addendum)
Your symptoms are following the pattern of probable Lyme disease infection and you should continue taking the doxycycline, which is the treatment for this.  We are running the Lyme (and [facility] Spotted disease) titers to confirm.  In the interim, rest, make sure you are drinking plenty of fluids for hydration, ibuprofen or tylenol for fever.    Your doxycycline prescription is being extended for a total of 14 days - you will need to pick up an additional 8 tablets from your pharmacy.

## 2020-12-21 ENCOUNTER — Telehealth: Payer: Self-pay

## 2020-12-21 ENCOUNTER — Encounter: Payer: Self-pay | Admitting: Family Medicine

## 2020-12-21 ENCOUNTER — Telehealth: Payer: Self-pay | Admitting: Family Medicine

## 2020-12-21 NOTE — Telephone Encounter (Signed)
Nurses  May forward message to [facility]  I reviewed over the ultrasound from last year.  It showed fatty liver.  Additional lab work was negative for secondary causes.  It was recommended for the patient to do a follow-up liver profile last year.  None the less-additional lab work has been ordered for this year that he should do before his follow-up visit in August.  Also I saw that he was in the emergency department for a tick bite and associated illness and is currently on doxycycline for 2 weeks which he should complete.  If he has any further follow-up trouble regarding this illness he should notify us so we would see him sooner.  Otherwise he is to do his blood work and follow-up in August as planned.  At that time we can discuss whether or not to do a follow-up ultrasound of the liver.  And also discussed whether or not to be seen by specialists/gastroenterology if liver enzymes are still elevated.  Thanks-Dr. Nicki Reaper

## 2020-12-21 NOTE — Telephone Encounter (Signed)
Transition Care Management Follow-up Telephone Call Date of discharge and from where: 12/20/2020-[facility] ED  How have you been since you were released from the hospital? Patient stated he has been feeling the same, not better or worse.  Any questions or concerns? No  Items Reviewed: Did the pt receive and understand the discharge instructions provided? Yes  Medications obtained and verified? Yes  Other? No  Any new allergies since your discharge? No  Dietary orders reviewed? No Do you have support at home? Yes   Home Care and Equipment/Supplies: Were home health services ordered? not applicable If so, what is the name of the agency? N/A  Has the agency set up a time to come to the patient's home? not applicable Were any new equipment or medical supplies ordered?  No What is the name of the medical supply agency? N/A Were you able to get the supplies/equipment? not applicable Do you have any questions related to the use of the equipment or supplies? No  Functional Questionnaire: (I = Independent and D = Dependent) ADLs: I  Bathing/Dressing- I  Meal Prep- I  Eating- I  Maintaining continence- I  Transferring/Ambulation- I  Managing Meds- I  Follow up appointments reviewed:  PCP Hospital f/u appt confirmed? No  patient has physical scheduled with Dr. Wolfgang Phoenix on 01/15/2021. [facility] f/u appt confirmed? No   Are transportation arrangements needed? No  If their condition worsens, is the pt aware to call PCP or go to the Emergency Dept.? Yes Was the patient provided with contact information for the PCP's office or ED? Yes Was to pt encouraged to call back with questions or concerns? Yes

## 2020-12-21 NOTE — Telephone Encounter (Signed)
Nurses Please see my chart message from today Please talk with Darius Ramirez in the morning and see how he is feeling. If he is having chest discomfort and not feeling well I would like to really see him tomorrow Please confir with me-most likely we will be looking at having him come around 330 and have an EKG at that time

## 2020-12-22 NOTE — Telephone Encounter (Signed)
Pt states he can come at 11:40 on Friday. Please see my chart message.

## 2020-12-23 LAB — ROCKY MTN SPOTTED FVR ABS PNL(IGG+IGM)
RMSF IgG: NEGATIVE
RMSF IgM: 0.43 index (ref 0.00–0.89)

## 2020-12-25 ENCOUNTER — Ambulatory Visit (INDEPENDENT_AMBULATORY_CARE_PROVIDER_SITE_OTHER): Payer: Medicaid Other | Admitting: Family Medicine

## 2020-12-25 ENCOUNTER — Encounter: Payer: Self-pay | Admitting: Family Medicine

## 2020-12-25 ENCOUNTER — Other Ambulatory Visit: Payer: Self-pay

## 2020-12-25 VITALS — BP 132/84 | Temp 97.7°F | Wt 216.0 lb

## 2020-12-25 DIAGNOSIS — D229 Melanocytic nevi, unspecified: Secondary | ICD-10-CM

## 2020-12-25 DIAGNOSIS — K2900 Acute gastritis without bleeding: Secondary | ICD-10-CM | POA: Diagnosis not present

## 2020-12-25 DIAGNOSIS — R079 Chest pain, unspecified: Secondary | ICD-10-CM

## 2020-12-25 MED ORDER — FAMOTIDINE 40 MG PO TABS: 40.0000 mg | ORAL_TABLET | Freq: Every day | ORAL | 0 refills | Status: DC

## 2020-12-25 NOTE — Progress Notes (Signed)
Dermatology referral ordered in [facility]

## 2020-12-25 NOTE — Addendum Note (Signed)
Addended by: Dairl Ponder on: 12/25/2020 04:46 PM   Modules accepted: Orders

## 2020-12-25 NOTE — Progress Notes (Signed)
   Subjective:    Patient ID: Darius Ramirez, male    DOB: 1995/07/12, 25 y.o.   MRN: [id number]  HPI Pt here for ER follow up. Pt went to [facility] for chest pain. Pt states he is doing better since Sunday. Pt having intermittent chest pain at times.  Patient recently in the hospital ER for chest pain and tick bite concerning for the possibility of RMSF was placed on doxycycline for 2 weeks developed some heartburn patient had concerns and questions related to EKG computer over read stated that patient was having pericarditis which confused the patient His sister is a PA she looked at the EKG and felt it looked okay   Review of Systems     Objective:   Physical Exam Lungs clear heart regular pulse normal evidence of tick bite on the abdomen that is healing but has a scab Also has a very dark atypical mole on the abdomen       Assessment & Plan:  1. Chest pain, unspecified type EKG looks good I do not see any sign of pericarditis he does not have any angina symptoms or shortness of breath - EKG 12-Lead  2. Atypical nevi Referral to dermatology recommended because of atypical nevi  3. Acute gastritis without hemorrhage, unspecified gastritis type Gastritis symptoms related to doxycycline should gradually get better recommend Pepcid over the next 3 to 4 weeks

## 2020-12-28 DIAGNOSIS — Z419 Encounter for procedure for purposes other than remedying health state, unspecified: Secondary | ICD-10-CM | POA: Diagnosis not present

## 2020-12-30 ENCOUNTER — Ambulatory Visit
Admission: RE | Admit: 2020-12-30 | Discharge: 2020-12-30 | Disposition: A | Discharge status: Home / Self Care | Payer: Medicaid Other | Source: Ambulatory Visit | Attending: Family Medicine | Admitting: Family Medicine

## 2020-12-30 ENCOUNTER — Telehealth: Payer: Self-pay | Admitting: *Deleted

## 2020-12-30 ENCOUNTER — Ambulatory Visit (INDEPENDENT_AMBULATORY_CARE_PROVIDER_SITE_OTHER): Payer: Medicaid Other

## 2020-12-30 ENCOUNTER — Other Ambulatory Visit: Payer: Self-pay

## 2020-12-30 VITALS — BP 139/88 | HR 83 | Temp 98.3°F | Resp 16

## 2020-12-30 DIAGNOSIS — R0602 Shortness of breath: Secondary | ICD-10-CM | POA: Diagnosis not present

## 2020-12-30 DIAGNOSIS — K219 Gastro-esophageal reflux disease without esophagitis: Secondary | ICD-10-CM

## 2020-12-30 NOTE — Telephone Encounter (Signed)
Patient sent in appointment request thru My Chart requesting an appointment due to "Shortness of breath the past couple of days and gasping for air-also feeling more fatigued than before" Called patient and advised patient to go straight to ER for evaluation and treatment.

## 2020-12-30 NOTE — ED Triage Notes (Signed)
SOB  since Saturday.  States he had been bit by a tick 3 weeks ago and is taking doxycyline.  States he notices the SOB more in the morning time.  Has been taking Pepcid for acid reflux flare up

## 2020-12-30 NOTE — Telephone Encounter (Signed)
Agree thank you 

## 2020-12-30 NOTE — ED Provider Notes (Signed)
[facility]   [id number] 12/30/20 Arrival Time: [id number]  ASSESSMENT & PLAN:  1. Gastroesophageal reflux disease without esophagitis   2. SOB (shortness of breath)   GERD exacerbation.  I have personally viewed the imaging studies ordered this visit. CXR: normal; no pneumothorax  Will cont Pepcid. Has omeprazole at home to begin also. Hopefully these symptoms will resolve once he finishes doxycycline.   Follow-up Information     Luking, Elayne Snare, MD.   Specialty: Family Medicine Why: If worsening or failing to improve as anticipated. Contact information: [place] [address] [phone]                 Reviewed expectations re: course of current medical issues. Questions answered. Outlined signs and symptoms indicating need for more acute intervention. Patient verbalized understanding. After Visit Summary given.   SUBJECTIVE:  History from: patient. Darius Ramirez is a 25 y.o. male who reports acid reflux exacerbation and SOB in the morning since taking doxycycline for risk of tick-borne illness. Notes of July reviewed by me. Two normal ECGs done in ED. He reports no CP. On Pepcid for acid; moderate help. Denies: irregular heart beat, lower extremity edema, near-syncope, orthopnea, palpitations, paroxysmal nocturnal dyspnea, and syncope. Normal PO intake without n/v/d. Ambulatory without assistance. Illicit drug use: denied.  Social History   Tobacco Use  Smoking Status Never  Smokeless Tobacco Never   Social History   Substance and Sexual Activity  Alcohol Use Never     OBJECTIVE:  Vitals:   12/30/20 1152  BP: 139/88  Pulse: 83  Resp: 16  Temp: 98.3 F (36.8 C)  TempSrc: Temporal  SpO2: 98%    General appearance: alert, oriented, no acute distress Eyes: PERRLA; EOMI; conjunctivae normal HENT: normocephalic; atraumatic Neck: supple with FROM Lungs: without labored respirations; speaks full sentences  without difficulty; CTAB Heart: regular rate and rhythm without murmer Chest Wall: without tenderness to palpation Abdomen: soft, non-tender; no guarding or rebound tenderness Extremities: without edema; without calf swelling or tenderness; symmetrical without gross deformities Skin: warm and dry; without rash or lesions Neuro: normal gait Psychological: alert and cooperative; normal mood and affect   Imaging: DG Chest 2 View  Result Date: 12/30/2020 CLINICAL DATA:  Shortness of breath EXAM: CHEST - 2 VIEW COMPARISON:  None. FINDINGS: The heart size and mediastinal contours are within normal limits.No focal airspace disease. No pleural effusion or pneumothorax.No acute osseous abnormality. IMPRESSION: No evidence of acute cardiopulmonary disease. Electronically Signed   By: Maurine Simmering   On: 12/30/2020 12:25     No Known Allergies  History reviewed. No pertinent past medical history. Social History   Socioeconomic History   Marital status: Married    Spouse name: Not on file   Number of children: Not on file   Years of education: Not on file   Highest education level: Not on file  Occupational History   Not on file  Tobacco Use   Smoking status: Never   Smokeless tobacco: Never  Substance and Sexual Activity   Alcohol use: Never   Drug use: Never   Sexual activity: Yes    Birth control/protection: None    Comment: states he is married, monogamous   Other Topics Concern   Not on file  Social History Narrative   Not on file   Social Determinants of Health   Financial Resource Strain: Not on file  Food Insecurity: Not on file  Transportation Needs: Not on  file  Physical Activity: Not on file  Stress: Not on file  Social Connections: Not on file  Intimate Partner Violence: Not on file   Family History  Problem Relation Age of Onset   Hyperlipidemia Mother    Diabetes Father    Hypertension Father    Hyperlipidemia Father    History reviewed. No pertinent surgical  history.    Vanessa Kick, MD 12/30/20 1258

## 2021-01-05 DIAGNOSIS — Z79899 Other long term (current) drug therapy: Secondary | ICD-10-CM | POA: Diagnosis not present

## 2021-01-05 DIAGNOSIS — Z0000 Encounter for general adult medical examination without abnormal findings: Secondary | ICD-10-CM | POA: Diagnosis not present

## 2021-01-05 DIAGNOSIS — K760 Fatty (change of) liver, not elsewhere classified: Secondary | ICD-10-CM | POA: Diagnosis not present

## 2021-01-05 DIAGNOSIS — R748 Abnormal levels of other serum enzymes: Secondary | ICD-10-CM | POA: Diagnosis not present

## 2021-01-05 DIAGNOSIS — Z131 Encounter for screening for diabetes mellitus: Secondary | ICD-10-CM | POA: Diagnosis not present

## 2021-01-05 DIAGNOSIS — E785 Hyperlipidemia, unspecified: Secondary | ICD-10-CM | POA: Diagnosis not present

## 2021-01-06 LAB — LIPID PANEL
Chol/HDL Ratio: 6.1 ratio — ABNORMAL HIGH (ref 0.0–5.0)
Cholesterol, Total: 208 mg/dL — ABNORMAL HIGH (ref 100–199)
HDL: 34 mg/dL — ABNORMAL LOW (ref >39)
LDL Chol Calc (NIH): 138 mg/dL — ABNORMAL HIGH (ref 0–99)
Triglycerides: 198 mg/dL — ABNORMAL HIGH (ref 0–149)
VLDL Cholesterol Cal: 36 mg/dL (ref 5–40)

## 2021-01-06 LAB — BASIC METABOLIC PANEL
BUN/Creatinine Ratio: 11 (ref 9–20)
BUN: 10 mg/dL (ref 6–20)
CO2: 24 mmol/L (ref 20–29)
Calcium: 9.9 mg/dL (ref 8.7–10.2)
Chloride: 98 mmol/L (ref 96–106)
Creatinine, Ser: 0.87 mg/dL (ref 0.76–1.27)
Glucose: 85 mg/dL (ref 65–99)
Potassium: 5 mmol/L (ref 3.5–5.2)
Sodium: 139 mmol/L (ref 134–144)
eGFR: 121 mL/min/{1.73_m2} (ref >59)

## 2021-01-06 LAB — CBC WITH DIFFERENTIAL/PLATELET
Basophils Absolute: 0.1 10*3/uL (ref 0.0–0.2)
Basos: 1 %
EOS (ABSOLUTE): 0.3 10*3/uL (ref 0.0–0.4)
Eos: 3 %
Hematocrit: 46.7 % (ref 37.5–51.0)
Hemoglobin: 15.8 g/dL (ref 13.0–17.7)
Immature Grans (Abs): 0 10*3/uL (ref 0.0–0.1)
Immature Granulocytes: 0 %
Lymphocytes Absolute: 2.9 10*3/uL (ref 0.7–3.1)
Lymphs: 36 %
MCH: 28.9 pg (ref 26.6–33.0)
MCHC: 33.8 g/dL (ref 31.5–35.7)
MCV: 86 fL (ref 79–97)
Monocytes Absolute: 0.6 10*3/uL (ref 0.1–0.9)
Monocytes: 7 %
Neutrophils Absolute: 4.3 10*3/uL (ref 1.4–7.0)
Neutrophils: 53 %
Platelets: 289 10*3/uL (ref 150–450)
RBC: 5.46 x10E6/uL (ref 4.14–5.80)
RDW: 12.3 % (ref 11.6–15.4)
WBC: 8.2 10*3/uL (ref 3.4–10.8)

## 2021-01-06 LAB — HEPATIC FUNCTION PANEL
ALT: 68 IU/L — ABNORMAL HIGH (ref 0–44)
AST: 35 IU/L (ref 0–40)
Albumin: 4.8 g/dL (ref 4.1–5.2)
Alkaline Phosphatase: 65 IU/L (ref 44–121)
Bilirubin Total: 0.4 mg/dL (ref 0.0–1.2)
Bilirubin, Direct: 0.11 mg/dL (ref 0.00–0.40)
Total Protein: 8 g/dL (ref 6.0–8.5)

## 2021-01-08 ENCOUNTER — Other Ambulatory Visit: Payer: Self-pay

## 2021-01-08 DIAGNOSIS — R748 Abnormal levels of other serum enzymes: Secondary | ICD-10-CM

## 2021-01-12 ENCOUNTER — Encounter: Payer: Self-pay | Admitting: Internal Medicine

## 2021-01-15 ENCOUNTER — Other Ambulatory Visit: Payer: Self-pay

## 2021-01-15 ENCOUNTER — Ambulatory Visit (INDEPENDENT_AMBULATORY_CARE_PROVIDER_SITE_OTHER): Payer: Medicaid Other | Admitting: Family Medicine

## 2021-01-15 VITALS — BP 140/90 | HR 91 | Ht 68.0 in | Wt 217.0 lb

## 2021-01-15 DIAGNOSIS — E785 Hyperlipidemia, unspecified: Secondary | ICD-10-CM

## 2021-01-15 DIAGNOSIS — R748 Abnormal levels of other serum enzymes: Secondary | ICD-10-CM | POA: Diagnosis not present

## 2021-01-15 DIAGNOSIS — Z131 Encounter for screening for diabetes mellitus: Secondary | ICD-10-CM | POA: Diagnosis not present

## 2021-01-15 DIAGNOSIS — Z0001 Encounter for general adult medical examination with abnormal findings: Secondary | ICD-10-CM

## 2021-01-15 DIAGNOSIS — Z0000 Encounter for general adult medical examination without abnormal findings: Secondary | ICD-10-CM

## 2021-01-15 DIAGNOSIS — K760 Fatty (change of) liver, not elsewhere classified: Secondary | ICD-10-CM

## 2021-01-15 NOTE — Progress Notes (Signed)
   Subjective:    Patient ID: Darius Ramirez, male    DOB: [birth year], 25 y.o.   MRN: [id number]  HPI  The patient comes in today for a wellness visit.  Patient trying to eat healthy Trying to do a little bit better job of exercising Tries to be safe and what he does  A review of their health history was completed.  A review of medications was also completed.  Any needed refills; no  Eating habits: could be better  Falls/  MVA accidents in past few months: no  Regular exercise: walking  Specialist pt sees on regular basis: no  Preventative health issues were discussed.   Additional concerns: none   Review of Systems     Objective:   Physical Exam General-in no acute distress Eyes-no discharge Lungs-respiratory rate normal, CTA CV-no murmurs,RRR Extremities skin warm dry no edema Neuro grossly normal Behavior normal, alert  Patient does not smoke does not drink Has family history of diabetes and obesity     Assessment & Plan:  1. Well adult exam Adult wellness-complete.wellness physical was conducted today. Importance of diet and exercise were discussed in detail.  In addition to this a discussion regarding safety was also covered. We also reviewed over immunizations and gave recommendations regarding current immunization needed for age.  In addition to this additional areas were also touched on including: Preventative health exams needed:  Colonoscopy not indicated  Patient was advised yearly wellness exam   2. Hyperlipidemia, unspecified hyperlipidemia type Healthy diet recommended.  Recheck this again by later this year  3. Elevated liver enzymes Will recheck this again later this year naproxen 4 months  4. Screening for diabetes mellitus  History of acanthosis nigricans also fatty liver is requesting glucose and A1c 5. Fatty liver Will be seeing GI Ultrasound liver ordered

## 2021-01-15 NOTE — Patient Instructions (Signed)
https://www.nhlbi.nih.gov/files/docs/public/heart/dash_brief.pdf">  DASH Eating Plan DASH stands for Dietary Approaches to Stop Hypertension. The DASH eating plan is a healthy eating plan that has been shown to: Reduce high blood pressure (hypertension). Reduce your risk for type 2 diabetes, heart disease, and stroke. Help with weight loss. What are tips for following this plan? Reading food labels Check food labels for the amount of salt (sodium) per serving. Choose foods with less than 5 percent of the Daily Value of sodium. Generally, foods with less than 300 milligrams (mg) of sodium per serving fit into this eating plan. To find whole grains, look for the word "whole" as the first word in the ingredient list. Shopping Buy products labeled as "low-sodium" or "no salt added." Buy fresh foods. Avoid canned foods and pre-made or frozen meals. Cooking Avoid adding salt when cooking. Use salt-free seasonings or herbs instead of table salt or sea salt. Check with your health care provider or pharmacist before using salt substitutes. Do not fry foods. Cook foods using healthy methods such as baking, boiling, grilling, roasting, and broiling instead. Cook with heart-healthy oils, such as olive, canola, avocado, soybean, or sunflower oil. Meal planning  Eat a balanced diet that includes: 4 or more servings of fruits and 4 or more servings of vegetables each day. Try to fill one-half of your plate with fruits and vegetables. 6-8 servings of whole grains each day. Less than 6 oz (170 g) of lean meat, poultry, or fish each day. A 3-oz (85-g) serving of meat is about the same size as a deck of cards. One egg equals 1 oz (28 g). 2-3 servings of low-fat dairy each day. One serving is 1 cup (237 mL). 1 serving of nuts, seeds, or beans 5 times each week. 2-3 servings of heart-healthy fats. Healthy fats called omega-3 fatty acids are found in foods such as walnuts, flaxseeds, fortified milks, and eggs.  These fats are also found in cold-water fish, such as sardines, salmon, and mackerel. Limit how much you eat of: Canned or prepackaged foods. Food that is high in trans fat, such as some fried foods. Food that is high in saturated fat, such as fatty meat. Desserts and other sweets, sugary drinks, and other foods with added sugar. Full-fat dairy products. Do not salt foods before eating. Do not eat more than 4 egg yolks a week. Try to eat at least 2 vegetarian meals a week. Eat more home-cooked food and less restaurant, buffet, and fast food.  Lifestyle When eating at a restaurant, ask that your food be prepared with less salt or no salt, if possible. If you drink alcohol: Limit how much you use to: 0-1 drink a day for women who are not pregnant. 0-2 drinks a day for men. Be aware of how much alcohol is in your drink. In the [place] one drink equals one 12 oz bottle of beer (355 mL), one 5 oz glass of wine (148 mL), or one 1 oz glass of hard liquor (44 mL). General information Avoid eating more than 2,300 mg of salt a day. If you have hypertension, you may need to reduce your sodium intake to 1,500 mg a day. Work with your health care provider to maintain a healthy body weight or to lose weight. Ask what an ideal weight is for you. Get at least 30 minutes of exercise that causes your heart to beat faster (aerobic exercise) most days of the week. Activities may include walking, swimming, or biking. Work with your health care provider   or dietitian to adjust your eating plan to your individual calorie needs. What foods should I eat? Fruits All fresh, dried, or frozen fruit. Canned fruit in natural juice (without addedsugar). Vegetables Fresh or frozen vegetables (raw, steamed, roasted, or grilled). Low-sodium or reduced-sodium tomato and vegetable juice. Low-sodium or reduced-sodium tomatosauce and tomato paste. Low-sodium or reduced-sodium canned vegetables. Grains Whole-grain or  whole-wheat bread. Whole-grain or whole-wheat pasta. Brown rice. Oatmeal. Quinoa. Bulgur. Whole-grain and low-sodium cereals. Pita bread.Low-fat, low-sodium crackers. Whole-wheat flour tortillas. Meats and other proteins Skinless chicken or turkey. Ground chicken or turkey. Pork with fat trimmed off. Fish and seafood. Egg whites. Dried beans, peas, or lentils. Unsalted nuts, nut butters, and seeds. Unsalted canned beans. Lean cuts of beef with fat trimmed off. Low-sodium, lean precooked or cured meat, such as sausages or meatloaves. Dairy Low-fat (1%) or fat-free (skim) milk. Reduced-fat, low-fat, or fat-free cheeses. Nonfat, low-sodium ricotta or cottage cheese. Low-fat or nonfatyogurt. Low-fat, low-sodium cheese. Fats and oils Soft margarine without trans fats. Vegetable oil. Reduced-fat, low-fat, or light mayonnaise and salad dressings (reduced-sodium). Canola, safflower, olive, avocado, soybean, andsunflower oils. Avocado. Seasonings and condiments Herbs. Spices. Seasoning mixes without salt. Other foods Unsalted popcorn and pretzels. Fat-free sweets. The items listed above may not be a complete list of foods and beverages you can eat. Contact a dietitian for more information. What foods should I avoid? Fruits Canned fruit in a light or heavy syrup. Fried fruit. Fruit in cream or buttersauce. Vegetables Creamed or fried vegetables. Vegetables in a cheese sauce. Regular canned vegetables (not low-sodium or reduced-sodium). Regular canned tomato sauce and paste (not low-sodium or reduced-sodium). Regular tomato and vegetable juice(not low-sodium or reduced-sodium). Pickles. Olives. Grains Baked goods made with fat, such as croissants, muffins, or some breads. Drypasta or rice meal packs. Meats and other proteins Fatty cuts of meat. Ribs. Fried meat. Bacon. Bologna, salami, and other precooked or cured meats, such as sausages or meat loaves. Fat from the back of a pig (fatback). Bratwurst.  Salted nuts and seeds. Canned beans with added salt. Canned orsmoked fish. Whole eggs or egg yolks. Chicken or turkey with skin. Dairy Whole or 2% milk, cream, and half-and-half. Whole or full-fat cream cheese. Whole-fat or sweetened yogurt. Full-fat cheese. Nondairy creamers. Whippedtoppings. Processed cheese and cheese spreads. Fats and oils Butter. Stick margarine. Lard. Shortening. Ghee. Bacon fat. Tropical oils, suchas coconut, palm kernel, or palm oil. Seasonings and condiments Onion salt, garlic salt, seasoned salt, table salt, and sea salt. Worcestershire sauce. Tartar sauce. Barbecue sauce. Teriyaki sauce. Soy sauce, including reduced-sodium. Steak sauce. Canned and packaged gravies. Fish sauce. Oyster sauce. Cocktail sauce. Store-bought horseradish. Ketchup. Mustard. Meat flavorings and tenderizers. Bouillon cubes. Hot sauces. Pre-made or packaged marinades. Pre-made or packaged taco seasonings. Relishes. Regular saladdressings. Other foods Salted popcorn and pretzels. The items listed above may not be a complete list of foods and beverages you should avoid. Contact a dietitian for more information. Where to find more information National Heart, Lung, and Blood Institute: www.nhlbi.nih.gov American Heart Association: www.heart.org Academy of Nutrition and Dietetics: www.eatright.org National Kidney Foundation: www.kidney.org Summary The DASH eating plan is a healthy eating plan that has been shown to reduce high blood pressure (hypertension). It may also reduce your risk for type 2 diabetes, heart disease, and stroke. When on the DASH eating plan, aim to eat more fresh fruits and vegetables, whole grains, lean proteins, low-fat dairy, and heart-healthy fats. With the DASH eating plan, you should limit salt (sodium) intake to 2,300   mg a day. If you have hypertension, you may need to reduce your sodium intake to 1,500 mg a day. Work with your health care provider or dietitian to adjust  your eating plan to your individual calorie needs. This information is not intended to replace advice given to you by your health care provider. Make sure you discuss any questions you have with your healthcare provider. Document Revised: 04/19/2019 Document Reviewed: 04/19/2019 Elsevier Patient Education  2022 Elsevier Inc.  

## 2021-01-27 ENCOUNTER — Ambulatory Visit (HOSPITAL_COMMUNITY): Payer: Medicaid Other

## 2021-01-28 DIAGNOSIS — Z419 Encounter for procedure for purposes other than remedying health state, unspecified: Secondary | ICD-10-CM | POA: Diagnosis not present

## 2021-02-05 ENCOUNTER — Ambulatory Visit (HOSPITAL_COMMUNITY)
Admission: RE | Admit: 2021-02-05 | Discharge: 2021-02-05 | Disposition: A | Discharge status: Home / Self Care | Payer: Medicaid Other | Source: Ambulatory Visit | Attending: Family Medicine | Admitting: Family Medicine

## 2021-02-05 ENCOUNTER — Other Ambulatory Visit: Payer: Self-pay

## 2021-02-05 DIAGNOSIS — R748 Abnormal levels of other serum enzymes: Secondary | ICD-10-CM | POA: Diagnosis not present

## 2021-02-05 DIAGNOSIS — K760 Fatty (change of) liver, not elsewhere classified: Secondary | ICD-10-CM | POA: Insufficient documentation

## 2021-02-05 DIAGNOSIS — K7689 Other specified diseases of liver: Secondary | ICD-10-CM | POA: Diagnosis not present

## 2021-02-08 ENCOUNTER — Encounter: Payer: Self-pay | Admitting: Family Medicine

## 2021-02-27 DIAGNOSIS — Z419 Encounter for procedure for purposes other than remedying health state, unspecified: Secondary | ICD-10-CM | POA: Diagnosis not present

## 2021-03-30 DIAGNOSIS — Z419 Encounter for procedure for purposes other than remedying health state, unspecified: Secondary | ICD-10-CM | POA: Diagnosis not present

## 2021-04-07 ENCOUNTER — Encounter: Payer: Self-pay | Admitting: Gastroenterology

## 2021-04-29 DIAGNOSIS — Z419 Encounter for procedure for purposes other than remedying health state, unspecified: Secondary | ICD-10-CM | POA: Diagnosis not present

## 2021-05-14 ENCOUNTER — Ambulatory Visit: Payer: Medicaid Other | Admitting: Gastroenterology

## 2021-05-19 ENCOUNTER — Ambulatory Visit: Payer: Medicaid Other | Admitting: Gastroenterology

## 2021-05-28 DIAGNOSIS — R300 Dysuria: Secondary | ICD-10-CM | POA: Diagnosis not present

## 2021-05-30 DIAGNOSIS — Z419 Encounter for procedure for purposes other than remedying health state, unspecified: Secondary | ICD-10-CM | POA: Diagnosis not present

## 2021-06-04 ENCOUNTER — Ambulatory Visit: Payer: Medicaid Other | Admitting: Family Medicine

## 2021-06-30 DIAGNOSIS — Z419 Encounter for procedure for purposes other than remedying health state, unspecified: Secondary | ICD-10-CM | POA: Diagnosis not present

## 2021-07-28 DIAGNOSIS — Z419 Encounter for procedure for purposes other than remedying health state, unspecified: Secondary | ICD-10-CM | POA: Diagnosis not present

## 2021-08-28 DIAGNOSIS — Z419 Encounter for procedure for purposes other than remedying health state, unspecified: Secondary | ICD-10-CM | POA: Diagnosis not present

## 2021-09-04 NOTE — Progress Notes (Deleted)
? ?Referring Provider:Luking, Elayne Snare, MD ?Primary Care Physician:  Kathyrn Drown, MD ?Primary Gastroenterologist:  Dr.  Marland Kitchen ?No chief complaint on file. ? ? ?HPI:   ?Darius Ramirez is a 26 y.o. male presenting today at the request of Luking, Elayne Snare, MD for elevated liver enzymes. ? ?First labs in our system in March 2021 with AST 55, ALT 147, alk phos 61, total bilirubin 0.5.  Additional labs at that time revealed hepatitis B surface antigen negative, hepatitis C antibody negative, ASMA negative, ceruloplasmin 24.1, iron panel with ferritin within normal limits. ? ?RUQ ultrasound April 2021 with normal-appearing gallbladder and CBD, fatty liver. ? ?Repeat labs August 2022 with ALT 68, AST 35, alk phos 65, total bilirubin 0.4. CBC within normal limits.  Repeat RUQ ultrasound September 2022 again with hepatic steatosis. ? ?Today: ? ? ?No past medical history on file. ? ?No past surgical history on file. ? ?Current Outpatient Medications  ?Medication Sig Dispense Refill  ? famotidine (PEPCID) 40 MG tablet Take 1 tablet (40 mg total) by mouth daily. 30 tablet 0  ? ?No current facility-administered medications for this visit.  ? ? ?Allergies as of 09/06/2021  ? (No Known Allergies)  ? ? ?Family History  ?Problem Relation Age of Onset  ? Hyperlipidemia Mother   ? Diabetes Father   ? Hypertension Father   ? Hyperlipidemia Father   ? ? ?Social History  ? ?Socioeconomic History  ? Marital status: Married  ?  Spouse name: Not on file  ? Number of children: Not on file  ? Years of education: Not on file  ? Highest education level: Not on file  ?Occupational History  ? Not on file  ?Tobacco Use  ? Smoking status: Never  ? Smokeless tobacco: Never  ?Substance and Sexual Activity  ? Alcohol use: Never  ? Drug use: Never  ? Sexual activity: Yes  ?  Birth control/protection: None  ?  Comment: states he is married, monogamous   ?Other Topics Concern  ? Not on file  ?Social History Narrative  ? Not on file  ? ?Social  Determinants of Health  ? ?Financial Resource Strain: Not on file  ?Food Insecurity: Not on file  ?Transportation Needs: Not on file  ?Physical Activity: Not on file  ?Stress: Not on file  ?Social Connections: Not on file  ?Intimate Partner Violence: Not on file  ? ? ?Review of Systems: ?Gen: Denies any fever, chills, cold or flulike symptoms, presyncope, syncope. ?CV: Denies chest pain, heart palpitations ?Resp: Denies shortness of breath or cough.  ?GI: See HPI ?GU : Denies urinary burning, urinary frequency, urinary hesitancy ?MS: Denies joint pain ?Derm: Denies rash ?Psych: Denies depression, anxiety ?Heme: See HPI ? ?Physical Exam: ?There were no vitals taken for this visit. ?General:   Alert and oriented. Pleasant and cooperative. Well-nourished and well-developed.  ?Head:  Normocephalic and atraumatic. ?Eyes:  Without icterus, sclera clear and conjunctiva pink.  ?Ears:  Normal auditory acuity. ?Lungs:  Clear to auscultation bilaterally. No wheezes, rales, or rhonchi. No distress.  ?Heart:  S1, S2 present without murmurs appreciated.  ?Abdomen:  +BS, soft, non-tender and non-distended. No HSM noted. No guarding or rebound. No masses appreciated.  ?Rectal:  Deferred  ?Msk:  Symmetrical without gross deformities. Normal posture. ?Extremities:  Without edema. ?Neurologic:  Alert and  oriented x4;  grossly normal neurologically. ?Skin:  Intact without significant lesions or rashes. ?Psych: Normal mood and affect. ? ? ? ?Assessment:  ? ? ? ?  Plan:  ?*** ? ? ?Aliene Altes, PA-C ?[facility] ?09/06/2021 ?  ?

## 2021-09-06 ENCOUNTER — Encounter: Payer: Self-pay | Admitting: Internal Medicine

## 2021-09-06 ENCOUNTER — Ambulatory Visit: Payer: Medicaid Other | Admitting: Gastroenterology

## 2021-09-13 IMAGING — US US ABDOMEN LIMITED
1 series · 14 of 25 positions shown · non-contrast
Comparison: None.

CLINICAL DATA: Initial evaluation for elevated LFTs.

EXAM:
ULTRASOUND ABDOMEN LIMITED RIGHT UPPER QUADRANT

[Series 1: us abdomen limited ruq (liver/gb) · 14 of 46 slices shown]
[im 1/46]
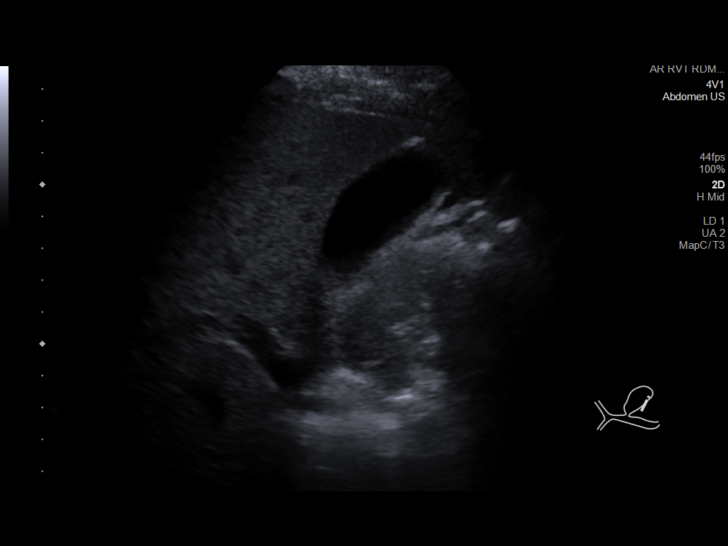
[im 4/46]
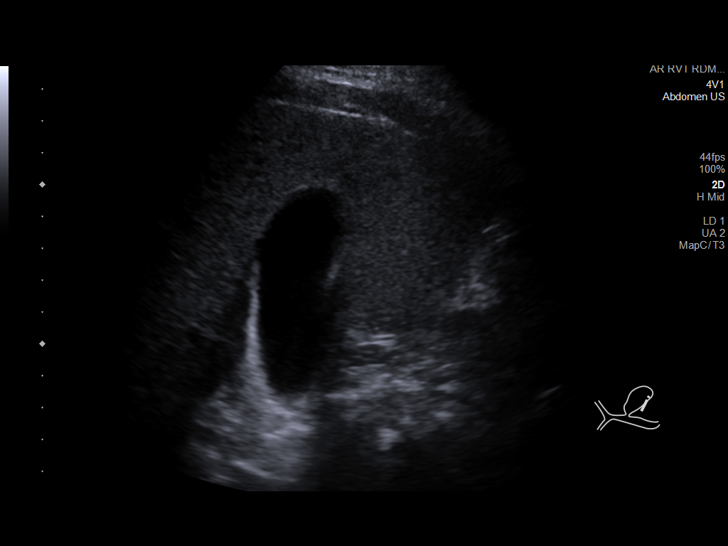
[im 8/46]
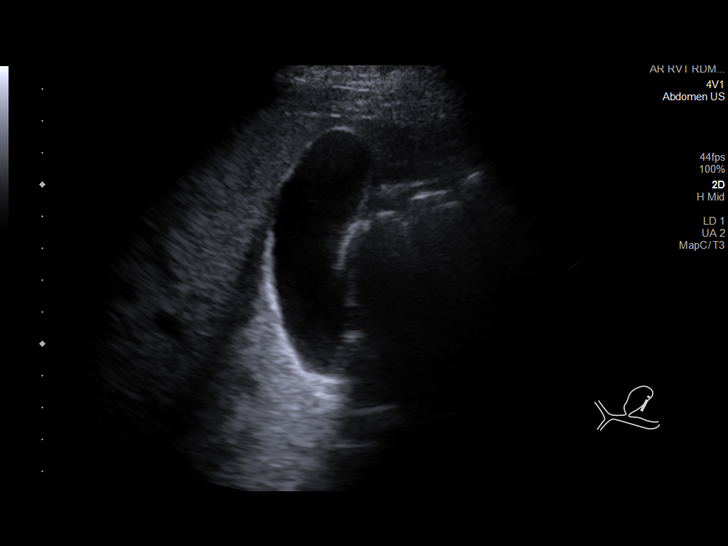
[im 12/46]
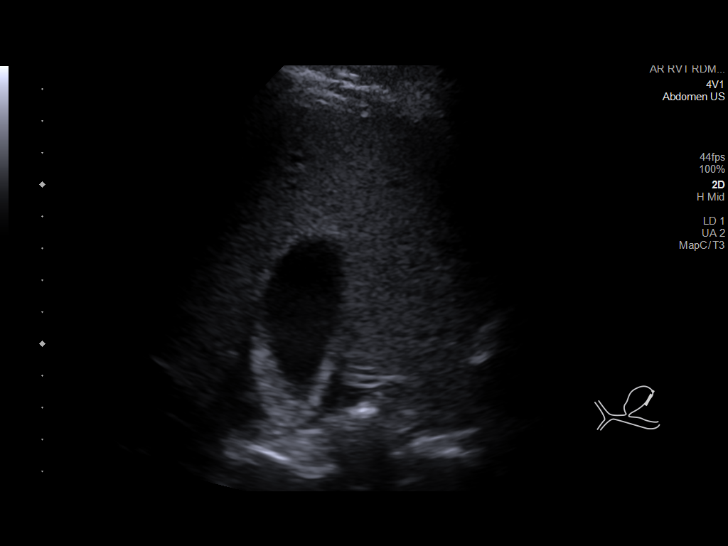
[im 16/46]
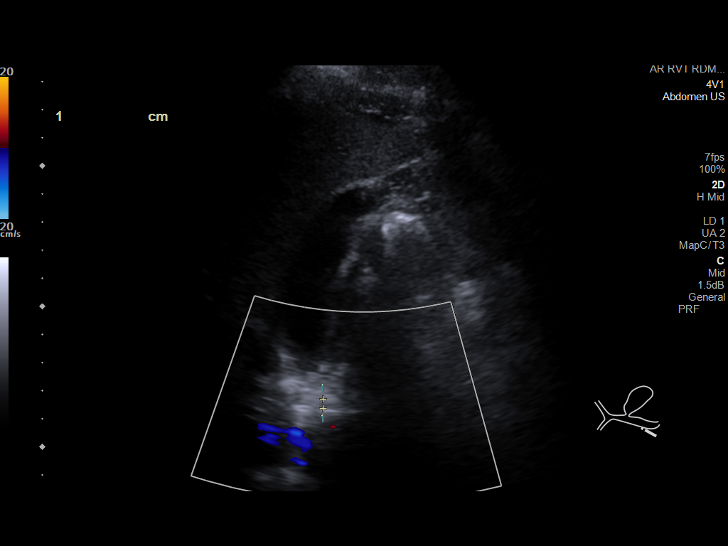
[im 17/46]
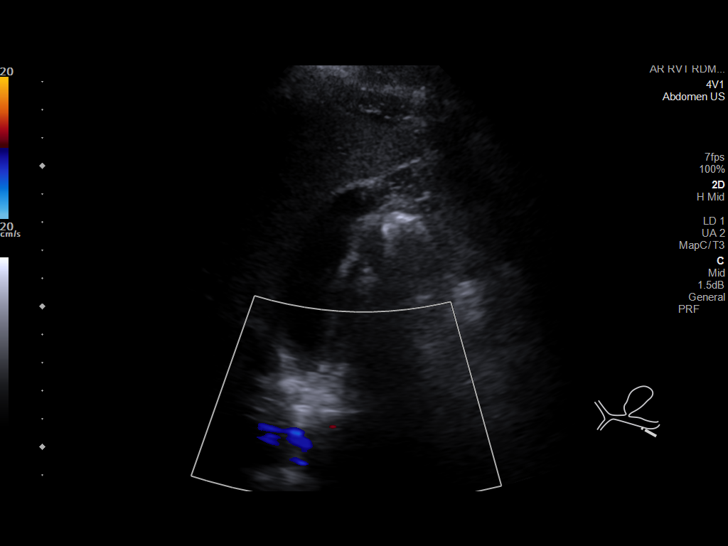
[im 21/46]
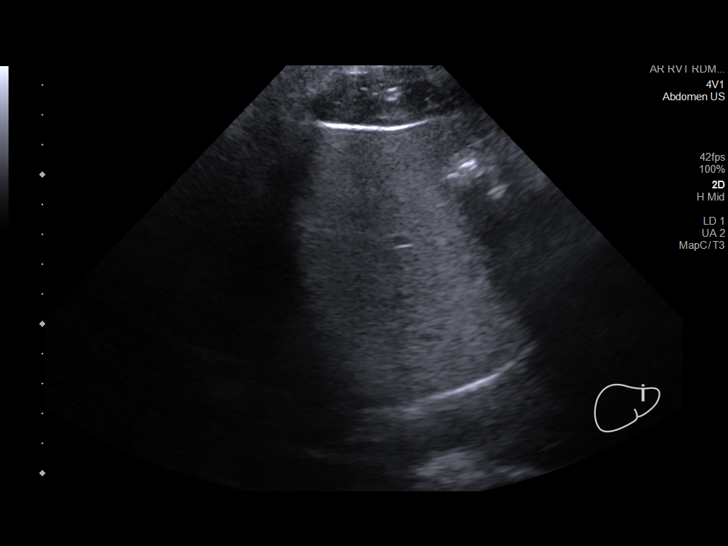
[im 25/46]
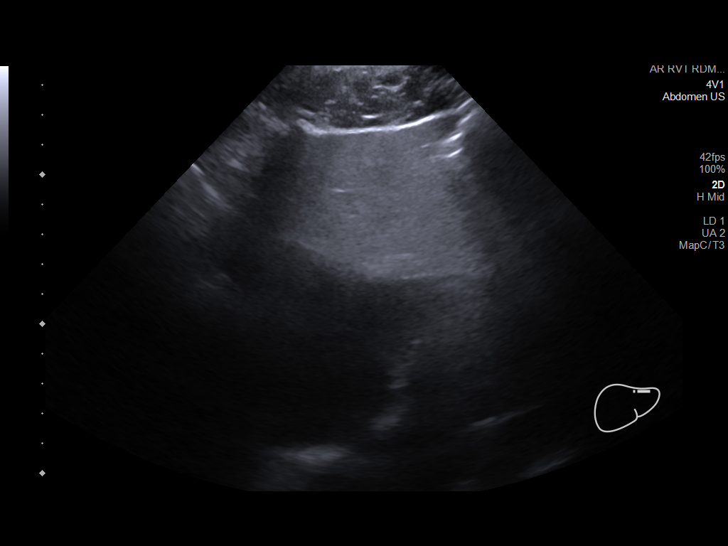
[im 29/46]
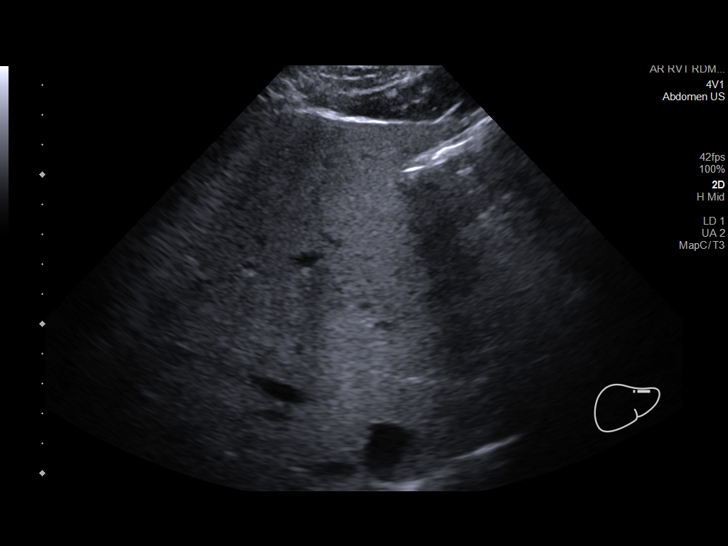
[im 31/46]
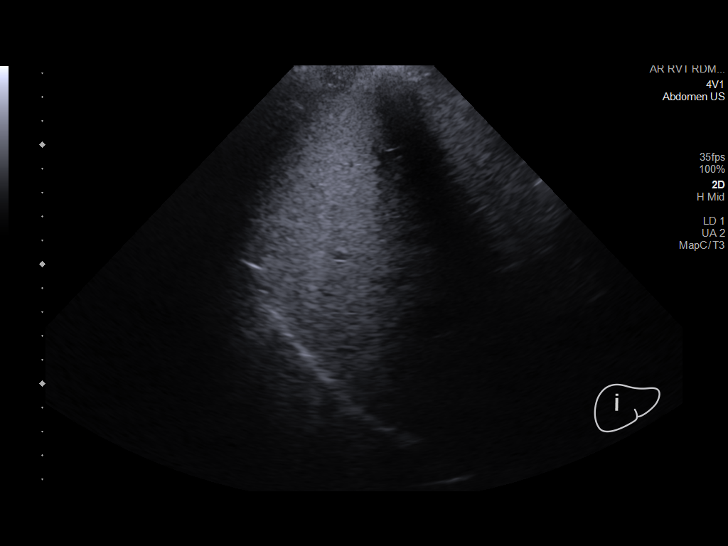
[im 34/46]
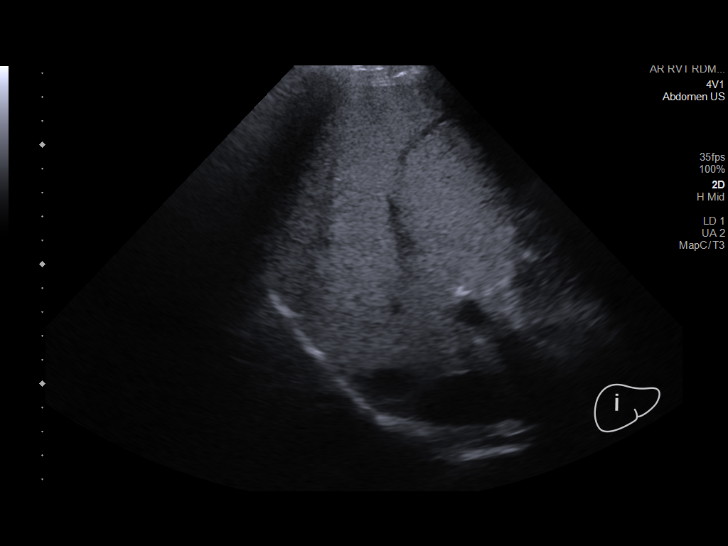
[im 38/46]
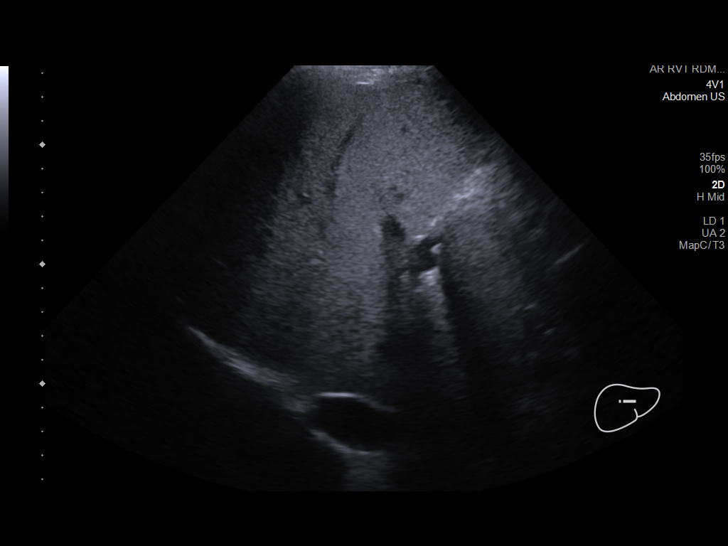
[im 42/46]
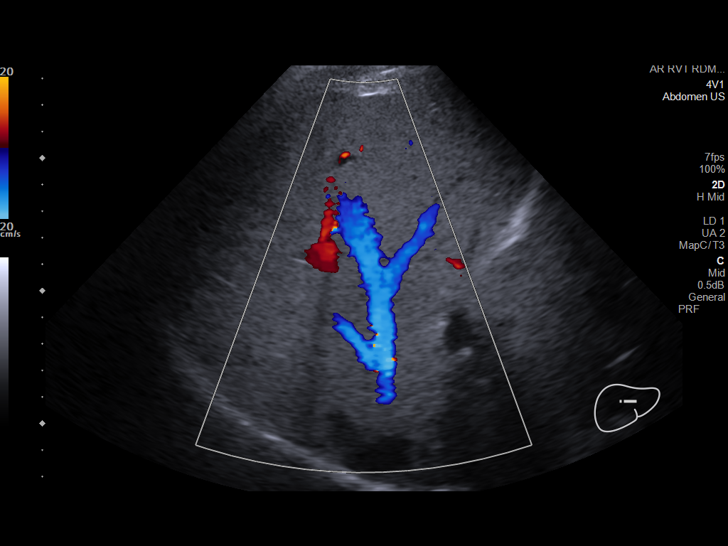
[im 46/46]
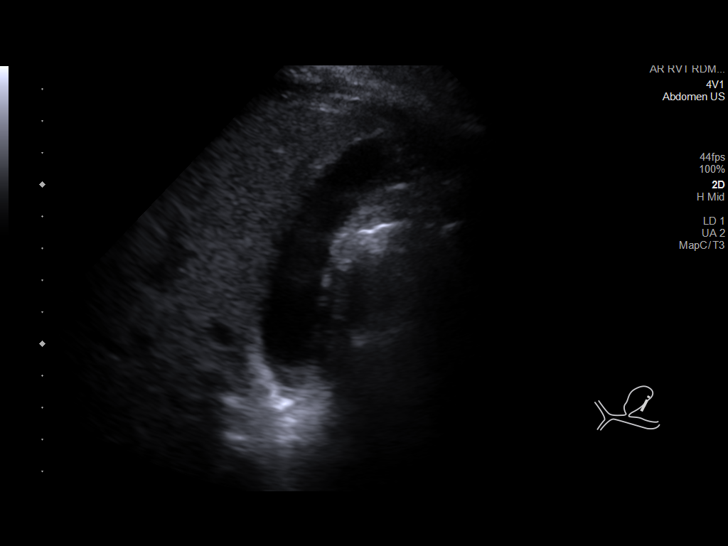

[14 of 25 positions shown; findings below may reference images not displayed]

FINDINGS: Gallbladder:

No gallstones or wall thickening visualized. No sonographic Murphy
sign noted by sonographer.

Common bile duct:

Diameter: 3 mm

Liver:

No focal lesion identified. Increased echogenicity within the liver
parenchyma. Portal vein is patent on color Doppler imaging with
normal direction of blood flow towards the liver.

Other: None.
IMPRESSION: 1. Increased echogenicity within the liver parenchyma, most
consistent with steatosis.
2. Otherwise unremarkable right upper quadrant ultrasound, with no
cholelithiasis or evidence for acute cholecystitis. No biliary
dilatation.

## 2021-09-27 DIAGNOSIS — Z419 Encounter for procedure for purposes other than remedying health state, unspecified: Secondary | ICD-10-CM | POA: Diagnosis not present

## 2021-10-28 DIAGNOSIS — Z419 Encounter for procedure for purposes other than remedying health state, unspecified: Secondary | ICD-10-CM | POA: Diagnosis not present

## 2021-11-15 ENCOUNTER — Ambulatory Visit
Admission: RE | Admit: 2021-11-15 | Discharge: 2021-11-15 | Disposition: A | Discharge status: Home / Self Care | Payer: Medicaid Other | Source: Ambulatory Visit

## 2021-11-15 VITALS — BP 140/88 | HR 100 | Temp 98.7°F | Resp 18

## 2021-11-15 DIAGNOSIS — J020 Streptococcal pharyngitis: Secondary | ICD-10-CM

## 2021-11-15 DIAGNOSIS — J069 Acute upper respiratory infection, unspecified: Secondary | ICD-10-CM

## 2021-11-15 LAB — POCT RAPID STREP A (OFFICE): Rapid Strep A Screen: POSITIVE — AB

## 2021-11-15 MED ORDER — PSEUDOEPH-BROMPHEN-DM 30-2-10 MG/5ML PO SYRP: 5.0000 mL | ORAL_SOLUTION | Freq: Four times a day (QID) | ORAL | 0 refills | Status: DC | PRN

## 2021-11-15 MED ORDER — IBUPROFEN 800 MG PO TABS: 800.0000 mg | ORAL_TABLET | Freq: Three times a day (TID) | ORAL | 0 refills | Status: AC

## 2021-11-15 MED ORDER — AMOXICILLIN 500 MG PO CAPS: 500.0000 mg | ORAL_CAPSULE | Freq: Two times a day (BID) | ORAL | 0 refills | Status: AC

## 2021-11-15 NOTE — ED Triage Notes (Signed)
Pt reports sore throat, fever 101. F and body aches x 2 days. Reports wife has Strep. Ibuprofen and Tylenol gives some relief.

## 2021-11-15 NOTE — Discharge Instructions (Addendum)
Take medication as prescribed. Increase fluids and allow for plenty of rest. Recommend over-the-counter children's Tylenol or ibuprofen as needed for pain, fever, or general discomfort. Warm salt water gargles 3-4 times daily to help with throat pain or discomfort. Recommend a diet with soft foods to include soups, broths, puddings, yogurt, Jell-O's, or popsicles until symptoms improve. Change toothbrush after 3 days. Follow-up if symptoms do not improve.

## 2021-11-15 NOTE — ED Provider Notes (Signed)
[facility]    CSN: [id number] Arrival date & time: 11/15/21  0831      History   Chief Complaint Chief Complaint  Patient presents with   Sore Throat    Entered by patient   Fever    HPI Darius Ramirez is a 26 y.o. male.   The history is provided by the patient.   Patient presents for complaints of sore throat, headache, cough, and nasal congestion has been present for the past 2 days.  Patient also complains of fever of 101, body aches.  He denies ear pain, wheezing, shortness of breath, difficulty breathing, abdominal pain, nausea, vomiting, or diarrhea.  Patient states that his wife was recently diagnosed with strep.  He has been taking ibuprofen and Tylenol for his symptoms.  History reviewed. No pertinent past medical history.  There are no problems to display for this patient.   History reviewed. No pertinent surgical history.     Home Medications    Prior to Admission medications   Medication Sig Start Date End Date Taking? Authorizing Provider  acetaminophen (TYLENOL) 500 MG tablet Take 500 mg by mouth every 6 (six) hours as needed.   Yes [provider]  amoxicillin (AMOXIL) 500 MG capsule Take 1 capsule (500 mg total) by mouth 2 (two) times daily for 10 days. 11/15/21 11/25/21 Yes Marcelino Campos-Warren, Alda Lea, NP  brompheniramine-pseudoephedrine-DM 30-2-10 MG/5ML syrup Take 5 mLs by mouth 4 (four) times daily as needed. 11/15/21  Yes Walden Statz-Warren, Alda Lea, NP  ibuprofen (ADVIL) 800 MG tablet Take 1 tablet (800 mg total) by mouth 3 (three) times daily. 11/15/21  Yes [place] Pelto-Warren, Alda Lea, NP  famotidine (PEPCID) 40 MG tablet Take 1 tablet (40 mg total) by mouth daily. 12/25/20   Kathyrn Drown, MD    Family History Family History  Problem Relation Age of Onset   Hyperlipidemia Mother    Diabetes Father    Hypertension Father    Hyperlipidemia Father     Social History Social History   Tobacco Use   Smoking status: Never    Smokeless tobacco: Never  Substance Use Topics   Alcohol use: Never   Drug use: Never     Allergies   Patient has no known allergies.   Review of Systems Review of Systems Per HPI  Physical Exam Triage Vital Signs ED Triage Vitals  Enc Vitals Group     BP 11/15/21 0844 140/88     Pulse Rate 11/15/21 0844 100     Resp 11/15/21 0844 18     Temp 11/15/21 0844 98.7 F (37.1 C)     Temp Source 11/15/21 0844 Oral     SpO2 11/15/21 0844 96 %     Weight --      Height --      Head Circumference --      Peak Flow --      Pain Score 11/15/21 0842 8     Pain Loc --      Pain Edu? --      Excl. in [place]? --    No data found.  Updated Vital Signs BP 140/88 (BP Location: Right Arm)   Pulse 100   Temp 98.7 F (37.1 C) (Oral)   Resp 18   SpO2 96%   Visual Acuity Right Eye Distance:   Left Eye Distance:   Bilateral Distance:    Right Eye Near:   Left Eye Near:    Bilateral Near:  Physical Exam Vitals and nursing note reviewed.  Constitutional:      Appearance: He is well-developed.  HENT:     Head: Normocephalic and atraumatic.     Right Ear: Tympanic membrane normal.     Left Ear: Tympanic membrane normal.     Nose: Congestion present. No rhinorrhea.     Right Turbinates: Enlarged and swollen.     Left Turbinates: Enlarged and swollen.     Right Sinus: No maxillary sinus tenderness or frontal sinus tenderness.     Left Sinus: No maxillary sinus tenderness or frontal sinus tenderness.     Mouth/Throat:     Pharynx: Uvula midline. Pharyngeal swelling, posterior oropharyngeal erythema and uvula swelling present.     Tonsils: Tonsillar exudate present. 2+ on the right. 2+ on the left.  Eyes:     Conjunctiva/sclera: Conjunctivae normal.     Pupils: Pupils are equal, round, and reactive to light.  Neck:     Thyroid: No thyromegaly.     Trachea: No tracheal deviation.  Cardiovascular:     Rate and Rhythm: Normal rate and regular rhythm.     Heart sounds: Normal  heart sounds.  Pulmonary:     Effort: Pulmonary effort is normal.     Breath sounds: Normal breath sounds.  Abdominal:     General: Bowel sounds are normal. There is no distension.     Palpations: Abdomen is soft.     Tenderness: There is no abdominal tenderness.  Musculoskeletal:     Cervical back: Normal range of motion and neck supple.  Skin:    General: Skin is warm and dry.  Neurological:     General: No focal deficit present.     Mental Status: He is alert and oriented to person, place, and time.  Psychiatric:        Mood and Affect: Mood normal.        Behavior: Behavior normal.        Thought Content: Thought content normal.        Judgment: Judgment normal.      UC Treatments / Results  Labs (all labs ordered are listed, but only abnormal results are displayed) Labs Reviewed  POCT RAPID STREP A (OFFICE) - Abnormal; Notable for the following components:      Result Value   Rapid Strep A Screen Positive (*)    All other components within normal limits    EKG   Radiology No results found.  Procedures Procedures (including critical care time)  Medications Ordered in UC Medications - No data to display  Initial Impression / Assessment and Plan / UC Course  I have reviewed the triage vital signs and the nursing notes.  Pertinent labs & imaging results that were available during my care of the patient were reviewed by me and considered in my medical decision making (see chart for details).  Patient presents for complaints of sore throat that been present for the past 2 days.  Patient's vital signs are stable, exam is reassuring.  There are no signs of a muffled voice, drooling, or difficulty swallowing.  He does have moderate tonsil and uvula swelling with exudate. The rapid strep test is  positive.  We will treat patient with azithromycin given her history of medication allergy to azithromycin.  Supportive care recommendations were provided to the patient's  mother.  Patient's mother advised to follow-up if symptoms worsen or do not improve.  Final Clinical Impressions(s) / UC Diagnoses   Final diagnoses:  Strep throat  Acute upper respiratory infection     Discharge Instructions      Take medication as prescribed. Increase fluids and allow for plenty of rest. Recommend over-the-counter children's Tylenol or ibuprofen as needed for pain, fever, or general discomfort. Warm salt water gargles 3-4 times daily to help with throat pain or discomfort. Recommend a diet with soft foods to include soups, broths, puddings, yogurt, Jell-O's, or popsicles until symptoms improve. Change toothbrush after 3 days. Follow-up if symptoms do not improve.      ED Prescriptions     Medication Sig Dispense Auth. Provider   brompheniramine-pseudoephedrine-DM 30-2-10 MG/5ML syrup Take 5 mLs by mouth 4 (four) times daily as needed. 140 mL Burhanuddin Kohlmann-Warren, Alda Lea, NP   ibuprofen (ADVIL) 800 MG tablet Take 1 tablet (800 mg total) by mouth 3 (three) times daily. 21 tablet Naamah Boggess-Warren, Alda Lea, NP   amoxicillin (AMOXIL) 500 MG capsule Take 1 capsule (500 mg total) by mouth 2 (two) times daily for 10 days. 20 capsule Curlie Macken-Warren, Alda Lea, NP      PDMP not reviewed this encounter.   Tish Men, NP 11/15/21 [phone]

## 2021-11-27 DIAGNOSIS — Z419 Encounter for procedure for purposes other than remedying health state, unspecified: Secondary | ICD-10-CM | POA: Diagnosis not present

## 2021-12-28 DIAGNOSIS — Z419 Encounter for procedure for purposes other than remedying health state, unspecified: Secondary | ICD-10-CM | POA: Diagnosis not present

## 2022-01-28 DIAGNOSIS — Z419 Encounter for procedure for purposes other than remedying health state, unspecified: Secondary | ICD-10-CM | POA: Diagnosis not present

## 2022-02-27 DIAGNOSIS — Z419 Encounter for procedure for purposes other than remedying health state, unspecified: Secondary | ICD-10-CM | POA: Diagnosis not present

## 2022-03-21 ENCOUNTER — Ambulatory Visit
Admission: EM | Admit: 2022-03-21 | Discharge: 2022-03-21 | Disposition: A | Discharge status: Home / Self Care | Payer: Medicaid Other | Attending: Nurse Practitioner | Admitting: Nurse Practitioner

## 2022-03-21 DIAGNOSIS — J020 Streptococcal pharyngitis: Secondary | ICD-10-CM | POA: Diagnosis not present

## 2022-03-21 LAB — POCT RAPID STREP A (OFFICE): Rapid Strep A Screen: POSITIVE — AB

## 2022-03-21 MED ORDER — DEXAMETHASONE SODIUM PHOSPHATE 10 MG/ML IJ SOLN
10.0000 mg | INTRAMUSCULAR | Status: AC
  Administered 2022-03-21: 10 mg via INTRAMUSCULAR

## 2022-03-21 MED ORDER — LIDOCAINE VISCOUS HCL 2 % MT SOLN: 10.0000 mL | Freq: Four times a day (QID) | OROMUCOSAL | 0 refills | Status: DC | PRN

## 2022-03-21 MED ORDER — PREDNISONE 20 MG PO TABS: 40.0000 mg | ORAL_TABLET | Freq: Every day | ORAL | 0 refills | Status: AC

## 2022-03-21 MED ORDER — PENICILLIN V POTASSIUM 500 MG PO TABS: 500.0000 mg | ORAL_TABLET | Freq: Two times a day (BID) | ORAL | 0 refills | Status: AC

## 2022-03-21 NOTE — ED Provider Notes (Signed)
[facility]    CSN: [id number] Arrival date & time: 03/21/22  1810      History   Chief Complaint Chief Complaint  Patient presents with   Sore Throat   Cough    HPI Darius Ramirez is a 26 y.o. male.   The history is provided by the patient.   Patient presents for complaints of sore throat and cough that started approximately 5 days ago.  He also complains of intermittent fevers and generalized body aches.  He denies drooling, muffled voice, ear pain, wheezing, shortness of breath, or difficulty breathing.  He states that his kids have been sick but they are better now.  He reports he has been taking ibuprofen for his symptoms.  History reviewed. No pertinent past medical history.  There are no problems to display for this patient.   History reviewed. No pertinent surgical history.     Home Medications    Prior to Admission medications   Medication Sig Start Date End Date Taking? Authorizing Provider  ibuprofen (ADVIL) 800 MG tablet Take 1 tablet (800 mg total) by mouth 3 (three) times daily. 11/15/21  Yes Vaeda Westall-Warren, Alda Lea, NP  lidocaine (XYLOCAINE) 2 % solution Use as directed 10 mLs in the mouth or throat every 6 (six) hours as needed for mouth pain. 03/21/22  Yes Bettylou Frew-Warren, Alda Lea, NP  penicillin v potassium (VEETID) 500 MG tablet Take 1 tablet (500 mg total) by mouth in the morning and at bedtime for 10 days. 03/21/22 03/31/22 Yes Lyah Millirons-Warren, Alda Lea, NP  predniSONE (DELTASONE) 20 MG tablet Take 2 tablets (40 mg total) by mouth daily with breakfast for 5 days. 03/21/22 03/26/22 Yes Anyae Griffith-Warren, Alda Lea, NP  acetaminophen (TYLENOL) 500 MG tablet Take 500 mg by mouth every 6 (six) hours as needed.    [provider]  brompheniramine-pseudoephedrine-DM 30-2-10 MG/5ML syrup Take 5 mLs by mouth 4 (four) times daily as needed. 11/15/21   Chanson Teems-Warren, Alda Lea, NP  famotidine (PEPCID) 40 MG tablet Take 1 tablet (40 mg total)  by mouth daily. 12/25/20   Kathyrn Drown, MD    Family History Family History  Problem Relation Age of Onset   Hyperlipidemia Mother    Diabetes Father    Hypertension Father    Hyperlipidemia Father     Social History Social History   Tobacco Use   Smoking status: Never   Smokeless tobacco: Never  Substance Use Topics   Alcohol use: Never   Drug use: Never     Allergies   Patient has no known allergies.   Review of Systems Review of Systems Per HPI  Physical Exam Triage Vital Signs ED Triage Vitals  Enc Vitals Group     BP 03/21/22 1853 135/88     Pulse Rate 03/21/22 1853 93     Resp 03/21/22 1853 20     Temp 03/21/22 1853 99.5 F (37.5 C)     Temp Source 03/21/22 1853 Oral     SpO2 03/21/22 1853 96 %     Weight --      Height --      Head Circumference --      Peak Flow --      Pain Score 03/21/22 1854 7     Pain Loc --      Pain Edu? --      Excl. in [place]? --    No data found.  Updated Vital Signs BP 135/88 (BP Location: Right Arm)  Pulse 93   Temp 99.5 F (37.5 C) (Oral)   Resp 20   SpO2 96%   Visual Acuity Right Eye Distance:   Left Eye Distance:   Bilateral Distance:    Right Eye Near:   Left Eye Near:    Bilateral Near:     Physical Exam Vitals and nursing note reviewed.  Constitutional:      General: He is not in acute distress.    Appearance: He is well-developed.  HENT:     Head: Normocephalic.     Right Ear: Tympanic membrane and ear canal normal.     Left Ear: Tympanic membrane and ear canal normal.     Nose: Congestion present.     Mouth/Throat:     Pharynx: Pharyngeal swelling, posterior oropharyngeal erythema and uvula swelling present.     Tonsils: No tonsillar exudate. 3+ on the right. 3+ on the left.  Eyes:     Conjunctiva/sclera: Conjunctivae normal.  Cardiovascular:     Rate and Rhythm: Normal rate and regular rhythm.     Heart sounds: Normal heart sounds.  Pulmonary:     Effort: Pulmonary effort is normal.  No respiratory distress.     Breath sounds: Normal breath sounds. No stridor. No wheezing, rhonchi or rales.  Abdominal:     General: Bowel sounds are normal.     Palpations: Abdomen is soft.     Tenderness: There is no abdominal tenderness.  Musculoskeletal:     Cervical back: Normal range of motion.  Lymphadenopathy:     Cervical: No cervical adenopathy.  Skin:    General: Skin is warm and dry.  Neurological:     General: No focal deficit present.     Mental Status: He is alert and oriented to person, place, and time.  Psychiatric:        Mood and Affect: Mood normal.        Behavior: Behavior normal.     UC Treatments / Results  Labs (all labs ordered are listed, but only abnormal results are displayed) Labs Reviewed  POCT RAPID STREP A (OFFICE) - Abnormal; Notable for the following components:      Result Value   Rapid Strep A Screen Positive (*)    All other components within normal limits    EKG   Radiology No results found.  Procedures Procedures (including critical care time)  Medications Ordered in UC Medications  dexamethasone (DECADRON) injection 10 mg (10 mg Intramuscular Given 03/21/22 1909)    Initial Impression / Assessment and Plan / UC Course  I have reviewed the triage vital signs and the nursing notes.  Pertinent labs & imaging results that were available during my care of the patient were reviewed by me and considered in my medical decision making (see chart for details).  Patient presents for complaints of sore throat.  Patient's vital signs are stable, he is in no acute distress.  On exam, patient has 3+ bilateral tonsil swelling.  Airway is slightly occluded.  Decadron 10 mg IM was given in the clinic.  Patient was started on penicillin the 500 mg, prednisone 40 mg for 5 days, and viscous lidocaine for his symptoms.  Supportive care recommendations were provided to the patient.  Patient was advised to follow-up in this clinic or with his  primary care if symptoms fail to improve.  Discussed with patient's strict indications of when to go to the emergency department.  Patient verbalizes understanding.  All questions were answered. Final Clinical  Impressions(s) / UC Diagnoses   Final diagnoses:  Streptococcal sore throat     Discharge Instructions      The rapid strep test is positive. Take medication as prescribed. Increase fluids and allow for plenty of rest. Recommend over-the-counter children's Tylenol or ibuprofen as needed for pain, fever, or general discomfort. Warm salt water gargles 3-4 times daily to help with throat pain or discomfort. Recommend a diet with soft foods to include soups, broths, puddings, yogurt, Jell-O's, or popsicles until symptoms improve. Change toothbrush after 3 days. Follow-up in this clinic or with your primary care physician if symptoms fail to improve after you have completed this treatment.     ED Prescriptions     Medication Sig Dispense Auth. Provider   penicillin v potassium (VEETID) 500 MG tablet Take 1 tablet (500 mg total) by mouth in the morning and at bedtime for 10 days. 20 tablet Richele Strand-Warren, Alda Lea, NP   lidocaine (XYLOCAINE) 2 % solution Use as directed 10 mLs in the mouth or throat every 6 (six) hours as needed for mouth pain. 100 mL Jull Harral-Warren, Alda Lea, NP   predniSONE (DELTASONE) 20 MG tablet Take 2 tablets (40 mg total) by mouth daily with breakfast for 5 days. 10 tablet Kamyia Thomason-Warren, Alda Lea, NP      PDMP not reviewed this encounter.   Tish Men, NP 03/21/22 2018

## 2022-03-21 NOTE — Discharge Instructions (Addendum)
The rapid strep test is positive. Take medication as prescribed. Increase fluids and allow for plenty of rest. Recommend over-the-counter children's Tylenol or ibuprofen as needed for pain, fever, or general discomfort. Warm salt water gargles 3-4 times daily to help with throat pain or discomfort. Recommend a diet with soft foods to include soups, broths, puddings, yogurt, Jell-O's, or popsicles until symptoms improve. Change toothbrush after 3 days. Follow-up in this clinic or with your primary care physician if symptoms fail to improve after you have completed this treatment.

## 2022-03-21 NOTE — ED Triage Notes (Signed)
cough sore throat fever and bodyaches, Taking Ibuprofen to help with pain.

## 2022-03-30 DIAGNOSIS — Z419 Encounter for procedure for purposes other than remedying health state, unspecified: Secondary | ICD-10-CM | POA: Diagnosis not present

## 2022-04-29 DIAGNOSIS — Z419 Encounter for procedure for purposes other than remedying health state, unspecified: Secondary | ICD-10-CM | POA: Diagnosis not present

## 2022-05-30 DIAGNOSIS — Z419 Encounter for procedure for purposes other than remedying health state, unspecified: Secondary | ICD-10-CM | POA: Diagnosis not present

## 2022-06-30 DIAGNOSIS — Z419 Encounter for procedure for purposes other than remedying health state, unspecified: Secondary | ICD-10-CM | POA: Diagnosis not present

## 2022-07-29 DIAGNOSIS — Z419 Encounter for procedure for purposes other than remedying health state, unspecified: Secondary | ICD-10-CM | POA: Diagnosis not present

## 2022-08-29 DIAGNOSIS — Z419 Encounter for procedure for purposes other than remedying health state, unspecified: Secondary | ICD-10-CM | POA: Diagnosis not present

## 2022-10-29 DIAGNOSIS — Z419 Encounter for procedure for purposes other than remedying health state, unspecified: Secondary | ICD-10-CM | POA: Diagnosis not present

## 2022-11-03 ENCOUNTER — Ambulatory Visit: Payer: Medicaid Other | Admitting: Nurse Practitioner

## 2022-11-28 DIAGNOSIS — Z419 Encounter for procedure for purposes other than remedying health state, unspecified: Secondary | ICD-10-CM | POA: Diagnosis not present

## 2022-12-29 DIAGNOSIS — Z419 Encounter for procedure for purposes other than remedying health state, unspecified: Secondary | ICD-10-CM | POA: Diagnosis not present

## 2023-01-29 DIAGNOSIS — Z419 Encounter for procedure for purposes other than remedying health state, unspecified: Secondary | ICD-10-CM | POA: Diagnosis not present

## 2023-02-28 DIAGNOSIS — Z419 Encounter for procedure for purposes other than remedying health state, unspecified: Secondary | ICD-10-CM | POA: Diagnosis not present

## 2023-03-31 DIAGNOSIS — Z419 Encounter for procedure for purposes other than remedying health state, unspecified: Secondary | ICD-10-CM | POA: Diagnosis not present

## 2023-04-30 DIAGNOSIS — Z419 Encounter for procedure for purposes other than remedying health state, unspecified: Secondary | ICD-10-CM | POA: Diagnosis not present

## 2023-05-31 DIAGNOSIS — Z419 Encounter for procedure for purposes other than remedying health state, unspecified: Secondary | ICD-10-CM | POA: Diagnosis not present

## 2023-07-01 DIAGNOSIS — Z419 Encounter for procedure for purposes other than remedying health state, unspecified: Secondary | ICD-10-CM | POA: Diagnosis not present

## 2023-07-29 DIAGNOSIS — Z419 Encounter for procedure for purposes other than remedying health state, unspecified: Secondary | ICD-10-CM | POA: Diagnosis not present

## 2023-09-09 DIAGNOSIS — Z419 Encounter for procedure for purposes other than remedying health state, unspecified: Secondary | ICD-10-CM | POA: Diagnosis not present

## 2023-09-14 ENCOUNTER — Encounter (INDEPENDENT_AMBULATORY_CARE_PROVIDER_SITE_OTHER): Payer: Self-pay | Admitting: *Deleted

## 2023-10-09 DIAGNOSIS — Z419 Encounter for procedure for purposes other than remedying health state, unspecified: Secondary | ICD-10-CM | POA: Diagnosis not present

## 2023-10-26 ENCOUNTER — Encounter (INDEPENDENT_AMBULATORY_CARE_PROVIDER_SITE_OTHER): Payer: Self-pay | Admitting: Gastroenterology

## 2023-10-26 ENCOUNTER — Telehealth (INDEPENDENT_AMBULATORY_CARE_PROVIDER_SITE_OTHER): Payer: Self-pay | Admitting: *Deleted

## 2023-10-26 ENCOUNTER — Ambulatory Visit (INDEPENDENT_AMBULATORY_CARE_PROVIDER_SITE_OTHER): Admitting: Gastroenterology

## 2023-10-26 VITALS — BP 121/79 | HR 87 | Temp 97.3°F | Ht 68.0 in | Wt 200.3 lb

## 2023-10-26 DIAGNOSIS — R1013 Epigastric pain: Secondary | ICD-10-CM

## 2023-10-26 DIAGNOSIS — R140 Abdominal distension (gaseous): Secondary | ICD-10-CM | POA: Diagnosis not present

## 2023-10-26 MED ORDER — OMEPRAZOLE 40 MG PO CPDR: 40.0000 mg | DELAYED_RELEASE_CAPSULE | Freq: Every day | ORAL | 3 refills | Status: AC

## 2023-10-26 NOTE — Telephone Encounter (Signed)
 [employer] PA:  CPT Code 60454 Description: CT ABDOMEN & PELVIS W/ Case Number: [id number] Review Date: 10/26/2023 8:56:12 AM Expiration Date: N/A Status: This member's plan does not currently require notification or prior-authorization through the [employer] Notification or Prior-Authorization Program. Please contact a Customer Care Professional at [phone] if you believe the information returned to be in error

## 2023-10-26 NOTE — Patient Instructions (Addendum)
 Schedule CT abdomen/pelvis with IV contrast Start omeprazole 40 mg daily Avoid using high dose aspirin including Goody/BC powders, NSAIDs such as Aleve, ibuprofen , naproxen, Motrin , Voltaren or Advil  (even the topical ones)

## 2023-10-26 NOTE — Progress Notes (Signed)
 Darius Ramirez, M.D. Gastroenterology & Hepatology [facility] [address] [place], [place] [address] Primary Care Physician: Sarrah Cure, PA-C [address] [place] [address]  Referring MD: PCP  Chief Complaint: Epigastric pain  History of Present Illness: Darius Ramirez is a 28 y.o. male who presents for evaluation of epigastric pain.  Patient reports having intermittent episodes of abdominal pain in his upper abdomen, around to the epigastric area, which he describes as a burning dull intermittent pain. It does not radiate. He reports that he cannot function well when he has the pain. It occurs every couple of months. Has been happening for the last year and a half. Last episode was  a month ago. He also reports having some bloating for a couple of days after having the pain episodes. He may have noticed more symptoms if he had a large meal.  He reports that he took PPIs for a few days which did not help. Also took Peptobismol which made the pain worse. He may take Tylenol for pain, which slightly helps.  He has not taken ibuprofen  recently.  He reports that a month ago he had stool, testing that was negative for H. pylori.  States he had a similar episodes of pain when he was younger.  He states that the only measure that has helped with the pain is using a heating pad. He uses it most of the day and resolves eventually.  The patient denies having any nausea, vomiting, fever, chills, hematochezia, melena, hematemesis,diarrhea, jaundice, pruritus or weight loss.  Patient has had a history of elevated liver enzymes from at least 2021When he had elevated aminotransferases with AST 5 and ALT 147, rest of the   Liver panel was normal. repeat CMP in  August 2022 showed CMP with AST 35, ALT 68.  Labs in referral paperwork -labs in 09/08/2023 showed normal CBC with WBC 7.5, hemoglobin 14.6, platelets 211, normal CMP ALT 6.0, total  bilirubin 0.4, AST 7.0, alkaline phosphatase 52, sodium 140, potassium 3.9, creatinine 1.04, BUN 12, negative H. pylori and stool.    Patient had a liver ultrasound performed on 02/05/2021 which showed hepatic steatosis but no other abnormalities in the right upper quadrant.  Last UJW:JXBJY Last Colonoscopy:never  FHx: neg for any gastrointestinal/liver disease, grandfather bladder cancer Social: neg smoking, alcohol or illicit drug use Surgical: no abdominal surgeries  Past Medical History:History reviewed. No pertinent past medical history.  Past Surgical History:History reviewed. No pertinent surgical history.  Family History: Family History  Problem Relation Age of Onset   Hyperlipidemia Mother    Diabetes Father    Hypertension Father    Hyperlipidemia Father     Social History: Social History   Tobacco Use  Smoking Status Never  Smokeless Tobacco Never   Social History   Substance and Sexual Activity  Alcohol Use Never   Social History   Substance and Sexual Activity  Drug Use Never    Allergies: No Known Allergies  Medications: Current Outpatient Medications  Medication Sig Dispense Refill   acetaminophen (TYLENOL) 500 MG tablet Take 500 mg by mouth every 6 (six) hours as needed.     ibuprofen  (ADVIL ) 800 MG tablet Take 1 tablet (800 mg total) by mouth 3 (three) times daily. 21 tablet 0   testosterone cypionate (DEPOTESTOSTERONE CYPIONATE) 200 MG/ML injection Inject 200 mg into the muscle once a week.     No current facility-administered medications for this visit.  Review of Systems: GENERAL: negative for malaise, night sweats HEENT: No changes in hearing or vision, no nose bleeds or other nasal problems. NECK: Negative for lumps, goiter, pain and significant neck swelling RESPIRATORY: Negative for cough, wheezing CARDIOVASCULAR: Negative for chest pain, leg swelling, palpitations, orthopnea GI: SEE HPI MUSCULOSKELETAL: Negative for joint pain or  swelling, back pain, and muscle pain. SKIN: Negative for lesions, rash PSYCH: Negative for sleep disturbance, mood disorder and recent psychosocial stressors. HEMATOLOGY Negative for prolonged bleeding, bruising easily, and swollen nodes. ENDOCRINE: Negative for cold or heat intolerance, polyuria, polydipsia and goiter. NEURO: negative for tremor, gait imbalance, syncope and seizures. The remainder of the review of systems is noncontributory.   Physical Exam: BP 121/79 (BP Location: Left Arm, Patient Position: Sitting, Cuff Size: Large)   Pulse 87   Temp (!) 97.3 F (36.3 C) (Temporal)   Ht 5\' 8"  (1.727 m)   Wt 200 lb 4.8 oz (90.9 kg)   BMI 30.46 kg/m  GENERAL: The patient is AO x3, in no acute distress. HEENT: Head is normocephalic and atraumatic. EOMI are intact. Mouth is well hydrated and without lesions. NECK: Supple. No masses LUNGS: Clear to auscultation. No presence of rhonchi/wheezing/rales. Adequate chest expansion HEART: RRR, normal s1 and s2. ABDOMEN: Soft, nontender, no guarding, no peritoneal signs, and nondistended. BS +. No masses. EXTREMITIES: Without any cyanosis, clubbing, rash, lesions or edema. NEUROLOGIC: AOx3, no focal motor deficit. SKIN: no jaundice, no rashes   Imaging/Labs: as above  I personally reviewed and interpreted the available labs, imaging and endoscopic files.  Impression and Plan: Darius Ramirez is a 28 y.o. male who presents for evaluation of epigastric pain.  The patient has presented intermittent episodes of severe abdominal pain in the epigastric area with some associated bloating episodes.  There have been no other associated symptoms but symptoms have been severe and did not improve with the use of over-the-counter medications.  We discussed the possibility of treating this with trial of PPI for next few months, but given the severity of his symptoms we will evaluate for organic etiologies with cross-sectional abdominal imaging which  will be ordered today.  If he presents more episodes and the imaging is unremarkable, we may need to proceed with an upper endoscopy in the future.  -Schedule CT abdomen/pelvis with IV contrast -Start omeprazole 40 mg daily -Avoid using high dose aspirin including Goody/BC powders, NSAIDs such as Aleve, ibuprofen , naproxen, Motrin , Voltaren or Advil  (even the topical ones)  All questions were answered.      Darius Cress, MD Gastroenterology and Hepatology [facility]

## 2023-11-07 ENCOUNTER — Ambulatory Visit (HOSPITAL_COMMUNITY)

## 2023-11-09 DIAGNOSIS — Z419 Encounter for procedure for purposes other than remedying health state, unspecified: Secondary | ICD-10-CM | POA: Diagnosis not present

## 2023-12-09 DIAGNOSIS — Z419 Encounter for procedure for purposes other than remedying health state, unspecified: Secondary | ICD-10-CM | POA: Diagnosis not present

## 2023-12-11 ENCOUNTER — Ambulatory Visit (HOSPITAL_COMMUNITY): Attending: Gastroenterology

## 2024-01-09 DIAGNOSIS — Z419 Encounter for procedure for purposes other than remedying health state, unspecified: Secondary | ICD-10-CM | POA: Diagnosis not present

## 2024-01-30 ENCOUNTER — Ambulatory Visit (INDEPENDENT_AMBULATORY_CARE_PROVIDER_SITE_OTHER): Admitting: Gastroenterology

## 2024-02-06 ENCOUNTER — Ambulatory Visit (INDEPENDENT_AMBULATORY_CARE_PROVIDER_SITE_OTHER): Admitting: Gastroenterology

## 2024-02-06 ENCOUNTER — Encounter (INDEPENDENT_AMBULATORY_CARE_PROVIDER_SITE_OTHER): Payer: Self-pay | Admitting: Gastroenterology

## 2024-02-09 DIAGNOSIS — Z419 Encounter for procedure for purposes other than remedying health state, unspecified: Secondary | ICD-10-CM | POA: Diagnosis not present

## 2024-03-13 ENCOUNTER — Encounter (INDEPENDENT_AMBULATORY_CARE_PROVIDER_SITE_OTHER): Payer: Self-pay | Admitting: Gastroenterology
